# Patient Record
Sex: Male | Born: 1970 | Race: White | Hispanic: No | Smoking: Former smoker
Health system: Southern US, Community
[De-identification: ages and names within clinical notes are randomized; demographics above are authoritative.]

## PROBLEM LIST (undated history)

## (undated) DIAGNOSIS — M549 Dorsalgia, unspecified: Secondary | ICD-10-CM

## (undated) DIAGNOSIS — E785 Hyperlipidemia, unspecified: Secondary | ICD-10-CM

## (undated) DIAGNOSIS — J45909 Unspecified asthma, uncomplicated: Secondary | ICD-10-CM

## (undated) DIAGNOSIS — K7581 Nonalcoholic steatohepatitis (NASH): Secondary | ICD-10-CM

## (undated) DIAGNOSIS — T7840XA Allergy, unspecified, initial encounter: Secondary | ICD-10-CM

## (undated) HISTORY — DX: Hyperlipidemia, unspecified: E78.5

## (undated) HISTORY — DX: Unspecified asthma, uncomplicated: J45.909

## (undated) HISTORY — DX: Dorsalgia, unspecified: M54.9

## (undated) HISTORY — DX: Nonalcoholic steatohepatitis (NASH): K75.81

## (undated) HISTORY — DX: Allergy, unspecified, initial encounter: T78.40XA

## (undated) HISTORY — PX: BACK SURGERY: SHX140

## (undated) HISTORY — PX: APPENDECTOMY: SHX54

---

## 2008-08-06 HISTORY — PX: SPINE SURGERY: SHX786

## 2009-02-17 ENCOUNTER — Ambulatory Visit (HOSPITAL_COMMUNITY): Admission: RE | Admit: 2009-02-17 | Discharge: 2009-02-18 | Payer: Self-pay | Admitting: Neurological Surgery

## 2010-11-12 LAB — CBC
Platelets: 299 10*3/uL (ref 150–400)
RDW: 12.8 % (ref 11.5–15.5)

## 2010-12-19 NOTE — Op Note (Signed)
NAME:  Nathan Holder NO.:  [id number]   MEDICAL RECORD NO.:  [id number]          PATIENT TYPE:  OIB   LOCATION:  2330                         FACILITY:  [facility]   PHYSICIAN:  Earleen Newport, M.D.  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   PROCEDURE:  Repeat microdiskectomy at L4-L5 left with operating  microscope microdissection technique.   SURGEON:  Earleen Newport, MD   FIRST ASSISTANT:  Ashok Pall, MD   ANESTHESIA:  General endotracheal.   INDICATIONS:  Nathan Holder is a 40 year old male who in 2003 or 2004  underwent a microdiskectomy in [place], [place] at L4-L5 on the left  side.  He did well after his initial surgery; however, he has had the  sudden recurrence of pain since the beginning of June.  He has now been  able to get over this despite efforts at conservative management  including steroids, bedrest, exercise, physical therapy in the passage  of time.  Symptoms have only been getting worse and he notes that he had  some modest weakness in the tibialis anterior on the left side and he  has evidence of recurrent herniation of disk at L4-L5 on MRI recently  performed, having failed efforts at conservative management and he was  advised regarding surgery.   PROCEDURE:  The patient was brought to the operating room, supine on the  stretcher.  After smooth induction of general endotracheal anesthesia,  he was turned prone.  The back was prepped with alcohol and DuraPrep and  draped in a sterile fashion.  Previously made lumbar incision was opened  on the inferior portion of it and the dissection was carried down  through the  lumbodorsal fascia.  Fascia was opened on the left side of  the midline, then a singular localizing radiograph identified the L4-L5  interspace.  Self-retaining Caspar retractor was placed in the wound and  through this aperture, the laminotomy was created to removing the  inferior margin lamina of L4 out to the medial wall of the facet.  Partial facetectomy was performed at the L4-L5 level.  Yellow ligament  was taken up with the scar tissue that was adherent to the dura.  This  was carefully dissected out to the lateral aspect of the dural tube.  Underneath a side of the dura particularly superior to the area, the  disk space was identified a substantial fragment of disk.  This was  removed with some difficulty as it was adherent to underlying epidural  fibrotic scar tissue.  Careful dissection yielded good decompression on  this area.  Dissection was then taken across the disk space where there  were some other small fragments of discontiguous with the disk space  itself.  These were removed in a piecemeal fashion, again being very  adherent to the surrounding disk  and scar tissue and the L5 nerve root  was then dissected very carefully and was found to be tethered laterally  and inferiorly to some underlying disk material and scar tissue.  With  this being released, the L5 nerve root could easily being mobilized  medially.  Undersurface of the disk space was quite adherent to the dura  itself and this required substantial peripheral dissection.  Disk space  was evacuated of a substantial quantity of severely degenerated and  desiccated disk material.  Ultimately, the area was irrigated copiously  with antibiotic irrigating solution and after number of attempts of  dissection more medially and inferiorly, no further disk material could  be add.  With this the procedure was completed, hemostasis was achieved.  The lumbodorsal fascia was closed with #1 Vicryl in interrupted fashion,  2-0  Vicryl was used in the subcutaneous tissues, 3-0 Vicryl  subcuticularly and Dermabond was placed on the skin.  The patient  tolerated the procedure well.  Blood loss was estimated at less than 50  mL.      Earleen Newport, M.D.  Electronically Signed     HJE/MEDQ  D:  02/17/2009  T:  02/18/2009  Job:  [id number]

## 2012-05-12 ENCOUNTER — Ambulatory Visit: Payer: BC Managed Care – PPO

## 2012-05-12 ENCOUNTER — Ambulatory Visit (INDEPENDENT_AMBULATORY_CARE_PROVIDER_SITE_OTHER): Payer: BC Managed Care – PPO | Admitting: Family Medicine

## 2012-05-12 ENCOUNTER — Encounter: Payer: Self-pay | Admitting: Physician Assistant

## 2012-05-12 VITALS — BP 126/86 | HR 61 | Temp 98.8°F | Resp 16 | Ht 72.0 in | Wt 290.4 lb

## 2012-05-12 DIAGNOSIS — M25539 Pain in unspecified wrist: Secondary | ICD-10-CM

## 2012-05-12 DIAGNOSIS — M549 Dorsalgia, unspecified: Secondary | ICD-10-CM

## 2012-05-12 DIAGNOSIS — J45909 Unspecified asthma, uncomplicated: Secondary | ICD-10-CM

## 2012-05-12 DIAGNOSIS — M25569 Pain in unspecified knee: Secondary | ICD-10-CM

## 2012-05-12 DIAGNOSIS — Z0000 Encounter for general adult medical examination without abnormal findings: Secondary | ICD-10-CM

## 2012-05-12 LAB — POCT URINALYSIS DIPSTICK
Bilirubin, UA: NEGATIVE
Blood, UA: NEGATIVE
Glucose, UA: NEGATIVE
Leukocytes, UA: NEGATIVE
Nitrite, UA: NEGATIVE

## 2012-05-12 LAB — LIPID PANEL
LDL Cholesterol: 170 mg/dL — ABNORMAL HIGH (ref 0–99)
Triglycerides: 96 mg/dL (ref <150)

## 2012-05-12 LAB — COMPREHENSIVE METABOLIC PANEL
Albumin: 4.6 g/dL (ref 3.5–5.2)
Alkaline Phosphatase: 54 U/L (ref 39–117)
CO2: 26 mEq/L (ref 19–32)
Glucose, Bld: 90 mg/dL (ref 70–99)
Potassium: 4.7 mEq/L (ref 3.5–5.3)
Sodium: 143 mEq/L (ref 135–145)
Total Protein: 7.2 g/dL (ref 6.0–8.3)

## 2012-05-12 LAB — CBC
Hemoglobin: 15.9 g/dL (ref 13.0–17.0)
MCHC: 34.3 g/dL (ref 30.0–36.0)
RBC: 4.97 MIL/uL (ref 4.22–5.81)
WBC: 4.7 10*3/uL (ref 4.0–10.5)

## 2012-05-12 LAB — POCT UA - MICROSCOPIC ONLY
Mucus, UA: POSITIVE
Yeast, UA: NEGATIVE

## 2012-05-12 LAB — LIPASE: Lipase: 11 U/L (ref 0–75)

## 2012-05-12 MED ORDER — CYCLOBENZAPRINE HCL 5 MG PO TABS: 5.0000 mg | ORAL_TABLET | Freq: Three times a day (TID) | ORAL | Status: DC | PRN

## 2012-05-12 MED ORDER — NAPROXEN 500 MG PO TABS: 500.0000 mg | ORAL_TABLET | Freq: Two times a day (BID) | ORAL | Status: DC

## 2012-05-12 MED ORDER — ALBUTEROL SULFATE HFA 108 (90 BASE) MCG/ACT IN AERS: 2.0000 | INHALATION_SPRAY | RESPIRATORY_TRACT | Status: DC | PRN

## 2012-05-12 NOTE — Progress Notes (Signed)
  Subjective:    Patient ID: Nathan Holder, male    DOB: May 18, 1971, 41 y.o.   MRN: [id number]  HPI    Review of Systems  Constitutional: Negative.   HENT: Negative.   Eyes: Negative.   Respiratory: Positive for wheezing.   Cardiovascular: Negative.   Gastrointestinal: Negative.   Genitourinary: Negative.   Musculoskeletal: Positive for back pain.  Skin: Negative.   Neurological: Negative.   Hematological: Negative.   Psychiatric/Behavioral: Positive for agitation.       Objective:   Physical Exam        Assessment & Plan:

## 2012-05-12 NOTE — Progress Notes (Signed)
Patient ID: Nathan Holder MRN: [id number], DOB: 1971/01/24 41 y.o. Date of Encounter: 05/12/2012, 3:36 PM  Primary Physician: No primary provider on file.  Chief Complaint: Physical (CPE)  HPI: 41 y.o. y/o male with history noted below here for CPE. Doing well. He does have several issues that he would like to discuss at today's visit.  1) Low back pain: This is a long standing issue for him. He has undergone two micro diskectomies one in 2004 and one in 2010. Both involving L4-L5. Since his most recent operation in 2010 he has never fully been without discomfort. He states at best he was 85% better, and it has slowly gotten worse over time. He understands that he has not done everything that he could do to help matters also. He does not want to undergo another operation for his back. He is open to any conservative measure. Denies any loss of bowel or bladder function. No radiation into the legs.   2) Left knee pain: About 2-3 weeks ago he fell onto some concrete and hit his left knee. Since this time he has been complaining of some discomfort along the medial aspect of the knee. He states it feels llike there is "glass" in it. It never became swollen, bruised, or erythematous. Always had full range of motion and able to fully apply his weight to it. Pain does not radiate distally or proximally.  3) Right wrist pain: Same fall as above. Patient fell out onto out stretched hand. Complains of pain and a popping along the distal ulna. Never with any swelling, bruising, or erythema. He does have a small knot at the distal ulna now, and he believes that it was there prior to this fall. He does have full range of motion and normal sensation. Pain does not radiate distally or proximally.   4) Asthma: His asthma is well controlled at baseline. He usually only requires use of his rescue inhaler 2-3 times per month due to allergies. He does request a new inhaler today.  5) "Sleeping sensation" in his  LLQ: HE states that every so often he will have a sensation that something in his abdomen will "go to sleep, like when my leg goes to sleep." He states this began 2-3 years ago and comes and goes. It has not happened for several months now. Will only last for seconds to a couple minutes then self resolves. No known triggers. No pain associated. No GI or GU symptoms. Very regular BM's without BRBPR or melena. No constipation or diarrhea.   Works as a Freight forwarder. Originally from [place], [place]. Married with 2 children. Enjoys spending time with the family and playing with the kids. Wishes that he could do more, but he is limited due to his back.    Review of Systems: Consitutional: No fever, chills, fatigue, night sweats, lymphadenopathy, or weight changes. Eyes: No visual changes, eye redness, or discharge. ENT/Mouth: Ears: No otalgia, tinnitus, hearing loss, discharge. Nose: No congestion, rhinorrhea, sinus pain, or epistaxis. Throat: No sore throat, post nasal drip, or teeth pain. Cardiovascular: No CP, palpitations, diaphoresis, DOE, edema, orthopnea, PND. Respiratory: No cough, hemoptysis, SOB. Gastrointestinal: No anorexia, dysphagia, reflux, pain, nausea, vomiting, hematemesis, diarrhea, constipation, BRBPR, or melena. Genitourinary: No dysuria, frequency, urgency, hematuria, incontinence, nocturia, decreased urinary stream, discharge, impotence, or testicular pain/masses. Musculoskeletal: see above. Skin: No rash, erythema, lesion changes, pain, warmth, jaundice, or pruritis. Neurological: No headache, dizziness, syncope, seizures, tremors, memory loss, coordination problems, or paresthesias. Psychological: No  anxiety, depression, hallucinations, SI/HI. Endocrine: No fatigue, polydipsia, polyphagia, polyuria, or known diabetes. All other systems were reviewed and are otherwise negative.  Past Medical History  Diagnosis Date  . Allergy   . Asthma      Past Surgical History    Procedure Date  . Appendectomy   . Back surgery     Home Meds:  Prior to Admission medications   Medication Sig Start Date End Date Taking? Authorizing Provider  fish oil-omega-3 fatty acids 1000 MG capsule Take 2 g by mouth daily.   Yes Historical Provider, MD  Flaxseed, Linseed, (FLAX SEEDS PO) Take by mouth.   Yes Historical Provider, MD  Multiple Vitamin (MULTIVITAMIN) tablet Take 1 tablet by mouth daily.   Yes Historical Provider, MD  Naproxen Sodium (ALEVE PO) Take by mouth.   Yes Historical Provider, MD  OVER THE COUNTER MEDICATION Vitamin B6 taken twice a day   Yes Historical Provider, MD  [place] taking one tablet daily   Yes Historical Provider, MD  OVER THE COUNTER MEDICATION Capsium patches prn   Yes Historical Provider, MD    Allergies: No Known Allergies  History   Social History  . Marital Status: Married    Spouse Name: N/A    Number of Children: N/A  . Years of Education: N/A   Occupational History  . Not on file.   Social History Main Topics  . Smoking status: Former Research scientist (life sciences)  . Smokeless tobacco: Never Used  . Alcohol Use: 0.5 - 1.0 oz/week    1-2 drink(s) per week  . Drug Use: No  . Sexually Active: Not on file   Other Topics Concern  . Not on file   Social History Narrative  . No narrative on file    Family History  Problem Relation Age of Onset  . Adopted: Yes    Physical Exam: Blood pressure 126/86, pulse 61, temperature 98.8 F (37.1 C), temperature source Oral, resp. rate 16, height 6' (1.829 m), weight 290 lb 6.4 oz (131.725 kg), SpO2 97.00%.  General: Well developed, well nourished, in no acute distress. HEENT: Normocephalic, atraumatic. Conjunctiva pink, sclera non-icteric. Pupils 2 mm constricting to 1 mm, round, regular, and equally reactive to light and accomodation. EOMI. Internal auditory canal clear. TMs with good cone of light and without pathology. Nasal mucosa pink. Nares are without discharge.  No sinus tenderness. Oral mucosa pink. Dentition normal. Pharynx without exudate.   Neck: Supple. Trachea midline. No thyromegaly. Full ROM. No lymphadenopathy. Lungs: Clear to auscultation bilaterally without wheezes, rales, or rhonchi. Breathing is of normal effort and unlabored. Cardiovascular: RRR with S1 S2. No murmurs, rubs, or gallops appreciated. Distal pulses 2+ symmetrically. No carotid or abdominal bruits. Abdomen: Soft, non-tender, non-distended with normoactive bowel sounds. No hepatosplenomegaly or masses. No rebound/guarding. No CVA tenderness. Without hernias. Well healed vertical surgical scar right side of the umbilicus. No pulsations. Genitourinary: Circumcised male. No penile lesions. Testes descended bilaterally, and smooth without tenderness or masses.  Musculoskeletal: Right wrist: No STS, ecchymosis, or erythema. TTP distal ulna. FROM. Flexion/extension intact. No scafoid TTP. No TTP of the carpals, meta carpals, or phalanges. No TTP of the distal radius. No TTP with squeezing of the ulna and radius. Left knee: No effusion, ecchymosis, or erythema. TTP medial joint line. No patellar TTP, or lateral deviation of the patellar. No lateral joint line TTP. Negative Anterior drawer, Lachman's, Varus stress, Valgus stress, and McMurray's. Well healed prior surgical scaring of the lumbar  region of the back. No midline TTP or paraspinal muscle TTP. FROM. Negative SLR bilaterally. Full range of motion and 5/5 strength throughout. Without swelling, atrophy, tenderness, crepitus, or warmth. Extremities without clubbing, cyanosis, or edema. Calves supple. Skin: Warm and moist without erythema, ecchymosis, wounds, or rash. Neuro: A+Ox3. CN II-XII grossly intact. Moves all extremities spontaneously. Full sensation throughout. Normal gait. DTR 2+ throughout upper and lower extremities. Finger to nose intact. Psych:  Responds to questions appropriately with a normal affect.   Studies:  Results for  orders placed in visit on 05/12/12  POCT UA - MICROSCOPIC ONLY      Component Value Range   WBC, Ur, HPF, POC 2-3     RBC, urine, microscopic 1-2     Bacteria, U Microscopic 1+     Mucus, UA positive     Epithelial cells, urine per micros neg     Crystals, Ur, HPF, POC neg     Casts, Ur, LPF, POC neg     Yeast, UA neg    POCT URINALYSIS DIPSTICK      Component Value Range   Color, UA yellow     Clarity, UA clear     Glucose, UA neg     Bilirubin, UA neg     Ketones, UA neg     Spec Grav, UA >=1.030     Blood, UA neg     pH, UA 5.0     Protein, UA neg     Urobilinogen, UA 0.2     Nitrite, UA neg     Leukocytes, UA Negative      CBC, CMET, Lipid, PSA, TSH all pending. Patient is fasting.  [facility] reading (PRIMARY) by  Dr. Carlota Raspberry. Left knee: medial degenerative changes otherwise negative Right wrist: Negative  Assessment/Plan:  41 y.o. y/o very pleasant Caucasian male here for CPE with low back pain, left knee sprain, right wrist sprain, and asthma.  1. Low back pain -Status post microdiskectomy in 2004 and 2010  -Referral to PT -Naprosyn 500 mg 1 po bid prn with food #60 RF 6 -Flexeril 5 mg 1 po tid prn #30 RF 6 -Patient is vocal that he certainly does not wish to have a third operation on his back. He voices that he has not done what he should have to help curtail some of his back pain. Should PT not offer him good quality of life I recommend referral to orthopedics for repeat MRI.   2. Left knee sprain -Rest -Naprosyn 500 mg 1 po bid with food #60 RF 6  3. Right wrist sprain -Rest -Naprosyn 500 mg 1 po bid with food #60 RF 6  4. Asthma -Well controlled at baseline -Proventil 2 puffs inhaled q 4-6 hours prn #1 RF 2 -If he sees that he is needing to use this frequently he is to RTC for further evaluation  5. CPE -Healthy diet and exercise -Weight loss -Await labs, treatment and follow pending -Since he is adopted and does not know his family history I expressed  the importance of follow up visits to him today. We will certainly need to trend some of his analytes to better help him understand his health.  -Advised him to keep a food diary when he is symptomatic of his abdominal "sleeping" sensation. This has been unchanged for 2-3 years now and is currently not an active issue. This certainly could be an unsual presentation of a food allergy. Should this change or worsen in any way he  is to let me know. -Declines flu vaccine -Anticipatory guidance -Greater than 1 hour spent face to face with patient today   Signed, Christell Faith, PA-C 05/12/2012 3:36 PM

## 2012-05-13 ENCOUNTER — Encounter: Payer: Self-pay | Admitting: Radiology

## 2012-05-13 ENCOUNTER — Other Ambulatory Visit: Payer: Self-pay | Admitting: Physician Assistant

## 2012-05-13 MED ORDER — ATORVASTATIN CALCIUM 10 MG PO TABS: 10.0000 mg | ORAL_TABLET | Freq: Every day | ORAL | Status: DC

## 2012-05-13 NOTE — Progress Notes (Signed)
Please see detailed note under labs from CPE on 05/12/12.

## 2012-06-10 ENCOUNTER — Encounter: Payer: Self-pay | Admitting: Physician Assistant

## 2012-06-10 ENCOUNTER — Telehealth: Payer: Self-pay | Admitting: Physician Assistant

## 2012-06-10 NOTE — Telephone Encounter (Signed)
[place] [phone] stating that I have been granted access to the personal health record for this patient. Web address and credentials are:   [email or web address] [id number] [email or web address]

## 2012-06-25 ENCOUNTER — Telehealth: Payer: Self-pay

## 2012-06-25 DIAGNOSIS — M549 Dorsalgia, unspecified: Secondary | ICD-10-CM

## 2012-06-25 MED ORDER — TRAMADOL HCL 50 MG PO TABS: 50.0000 mg | ORAL_TABLET | Freq: Four times a day (QID) | ORAL | Status: DC | PRN

## 2012-06-25 NOTE — Telephone Encounter (Signed)
Please tell Nathan Holder that I have sent in some stronger pain medication and written a prescription for a tens unit for him. I hope he is doing well.

## 2012-06-25 NOTE — Telephone Encounter (Signed)
Noted  

## 2012-06-25 NOTE — Telephone Encounter (Signed)
Ryan, do you want to Rx a TENS unit and/or a stronger pain medication?

## 2012-06-25 NOTE — Telephone Encounter (Signed)
PT SAW RYAN FOR BACK PAIN.  HE HAS BEEN GOING TO PHYSICAL THERAPY AND THE THERAPIST WOULD LIKE FOR RYAN TO WRITE A PRESCRIPTION FOR A TENS UNIT.  HE SAID THE EXERCISE AND THERAPY IS HELPING AND HAS MADE HIM MORE FLEXIBLE, BUT ALONG WITH THAT IS HAVING MORE PAIN.  WANTS TO KNOW IF WE CAN CALL HIM IN A STRONGER PAIN MEDICATION.  [phone]

## 2012-06-25 NOTE — Telephone Encounter (Signed)
Notified pt of Rx being sent to pharmacy and Rx for TENS unit is ready. Mailed TENS Rx to pt per his request. Pt wanted Nathan Holder to know that the naproxen and muscle relaxer absolutely helped his wrist, knee and back. It was only after starting the PT and exercising that his back pain returned/worsened. Pt will f/up as instr'd or sooner if needed.

## 2012-08-20 ENCOUNTER — Other Ambulatory Visit: Payer: Self-pay | Admitting: Physician Assistant

## 2012-09-29 ENCOUNTER — Encounter: Payer: Self-pay | Admitting: Physician Assistant

## 2012-09-29 ENCOUNTER — Ambulatory Visit (INDEPENDENT_AMBULATORY_CARE_PROVIDER_SITE_OTHER): Payer: BC Managed Care – PPO | Admitting: Physician Assistant

## 2012-09-29 VITALS — BP 122/70 | HR 78 | Temp 98.0°F | Resp 16 | Ht 71.75 in | Wt 283.2 lb

## 2012-09-29 DIAGNOSIS — M549 Dorsalgia, unspecified: Secondary | ICD-10-CM

## 2012-09-29 DIAGNOSIS — M25531 Pain in right wrist: Secondary | ICD-10-CM

## 2012-09-29 DIAGNOSIS — M25569 Pain in unspecified knee: Secondary | ICD-10-CM

## 2012-09-29 DIAGNOSIS — N478 Other disorders of prepuce: Secondary | ICD-10-CM

## 2012-09-29 DIAGNOSIS — J45909 Unspecified asthma, uncomplicated: Secondary | ICD-10-CM

## 2012-09-29 DIAGNOSIS — M25539 Pain in unspecified wrist: Secondary | ICD-10-CM

## 2012-09-29 DIAGNOSIS — M25562 Pain in left knee: Secondary | ICD-10-CM

## 2012-09-29 DIAGNOSIS — N475 Adhesions of prepuce and glans penis: Secondary | ICD-10-CM

## 2012-09-29 MED ORDER — ALBUTEROL SULFATE HFA 108 (90 BASE) MCG/ACT IN AERS: 2.0000 | INHALATION_SPRAY | RESPIRATORY_TRACT | Status: DC | PRN

## 2012-09-29 MED ORDER — NAPROXEN 500 MG PO TABS: 500.0000 mg | ORAL_TABLET | Freq: Two times a day (BID) | ORAL | Status: DC

## 2012-09-29 MED ORDER — TRAMADOL HCL 50 MG PO TABS: 50.0000 mg | ORAL_TABLET | Freq: Four times a day (QID) | ORAL | Status: DC | PRN

## 2012-09-29 MED ORDER — CYCLOBENZAPRINE HCL 5 MG PO TABS: 5.0000 mg | ORAL_TABLET | Freq: Three times a day (TID) | ORAL | Status: DC | PRN

## 2012-09-29 NOTE — Progress Notes (Signed)
Patient ID: Nathan Holder MRN: [id number], DOB: [birth year], 42 y.o. Date of Encounter: 09/29/2012, 6:00 PM  Primary Physician: No primary provider on file.  Chief Complaint: Medication refill  HPI: 42 y.o. year old male with history below presents for medication refill. Doing well. No issues or complaints. He has stopped going to see the physical therapist secondary to his improvement. He will work on the exercises they showed him 3-4 times per week. He no longer has any left knee pain or right wrist pain. He still does have some back pain that is well controlled with current medication regimen. He is currently only taking one Naprosyn 500 mg in the morning, one Ultram 50 mg later in the day, and one Flexeril 5 mg qhs prn. He states this is working well for him.   His asthma is well controlled. He states that he uses his albuterol inhaler maybe 3 times every 2 weeks. He does request a generic refill because the price of Proventil went up to $67.00.   Lastly he states that his wife frequently gets a vaginal yeast infection after intercourse. 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 He is open to having this surgically corrected. They do not want to go back to using condoms at this time and he is looking to help her quality of life.   Eating healthy. Trying to lose weight.     Past Medical History  Diagnosis Date  . Allergy   . Asthma   . Back pain      Home Meds: Prior to Admission medications   Medication Sig Start Date End Date Taking? Authorizing Provider  albuterol (PROVENTIL HFA;VENTOLIN HFA) 108 (90 BASE) MCG/ACT inhaler Inhale 2 puffs into the lungs every 4 (four) hours as needed for wheezing. 05/12/12  Yes Kyliegh Jester M Emileo Semel, PA-C  atorvastatin (LIPITOR) 10 MG tablet Take 1 tablet (10 mg total) by  mouth daily. 05/13/12  Yes Azzie Thiem M Miriam Liles, PA-C  cyclobenzaprine (FLEXERIL) 5 MG tablet Take 1 tablet (5 mg total) by mouth 3 (three) times daily as needed for muscle spasms. 05/12/12  Yes Taylour Lietzke M Kimmora Risenhoover, PA-C  fish oil-omega-3 fatty acids 1000 MG capsule Take 2 g by mouth daily.   Yes Historical Provider, MD  Flaxseed, Linseed, (FLAX SEEDS PO) Take by mouth.   Yes Historical Provider, MD  Multiple Vitamin (MULTIVITAMIN) tablet Take 1 tablet by mouth daily.   Yes Historical Provider, MD  naproxen (NAPROSYN) 500 MG tablet Take 1 tablet (500 mg total) by mouth 2 (two) times daily with a meal. 05/12/12  Yes Rashi Granier M Rommie Theophilus Walz, PA-C  OVER THE COUNTER MEDICATION Vitamin B6 taken twice a day   Yes Historical Provider, MD  traMADol (ULTRAM) 50 MG tablet TAKE 1 TABLET (50 MG TOTAL) BY MOUTH EVERY 6 (SIX) HOURS AS NEEDED FOR PAIN. 08/20/12  Yes Evi Mccomb M Allie Gerhold, PA-C  Naproxen Sodium (ALEVE PO) Take by mouth.    Historical Provider, MD  [place] taking one tablet daily    Historical Provider, MD  OVER THE COUNTER MEDICATION Capsium patches prn    Historical Provider, MD    Allergies: No Known Allergies  History   Social History  . Marital Status: Married    Spouse Name: N/A    Number of Children: N/A  .  Years of Education: N/A   Occupational History  . teacher    Social History Main Topics  . Smoking status: Former Research scientist (life sciences)  . Smokeless tobacco: Never Used  . Alcohol Use: .5 - 1 oz/week    1-2 drink(s) per week     Comment: drink daily-2 drinks  . Drug Use: No  . Sexually Active: Yes    Birth Control/ Protection: None     Comment: number of sex partners in the last 46 months 1   Other Topics Concern  . Not on file   Social History Narrative   Exercise stretches 3 x per week for 30-45 minutes     Review of Systems: Constitutional: negative for chills, fever, night sweats, weight changes, or fatigue  Cardiovascular: negative for chest pain or palpitations Respiratory:  negative for hemoptysis, wheezing, shortness of breath, or cough Musculoskeletal: see above Dermatological: negative for rash Neurologic: negative for headache, dizziness, or syncope   Physical Exam: Blood pressure 122/70, pulse 78, temperature 98 F (36.7 C), temperature source Oral, resp. rate 16, height 5' 11.75" (1.822 m), weight 283 lb 3.2 oz (128.459 kg), SpO2 97.00%., Body mass index is 38.7 kg/(m^2). General: Well developed, well nourished, in no acute distress. Head: Normocephalic, atraumatic, eyes without discharge, sclera non-icteric, nares are without discharge.   Neck: Supple. Full ROM. Lungs: Clear bilaterally to auscultation without wheezes, rales, or rhonchi. Breathing is unlabored. Heart: RRR with S1 S2. No murmurs, rubs, or gallops appreciated. Msk:  Strength and tone normal for age. Extremities/Skin: Warm and dry. No clubbing or cyanosis. No edema. No rashes or suspicious lesions. Back: Full range of motion and strength.   Neuro: Alert and oriented X 3. Moves all extremities spontaneously. Gait is normal. CNII-XII grossly in tact. Psych:  Responds to questions appropriately with a normal affect.     ASSESSMENT AND PLAN:  42 y.o. male with resolved knee pain, wrist pain, much improved back pain, asthma, and foreskin adhesion.  1. Knee pain -Resolved -Continue exercises  2. Wrist pain -Resolved -Continue exercises  3. Back pain -Much improved -Continue current treatment -Continue exercises -Naprosyn 500 mg 1 po bid prn #60 RF 6 -Ultram 50 mg 1 po q 6 hours prn #120 no RF, may call through August -Flexeril 5 mg 1 po tid prn #90 RF 6  4. Asthma -Well controlled -Continue current treatment -Ventolin 2 puffs inhaled q 4-6 hours prn #1 RF 2  5. Foreskin adhesion -Referral to Urology  6. Follow up 6 months    Signed, Christell Faith, PA-C 09/29/2012 6:00 PM

## 2012-12-03 ENCOUNTER — Other Ambulatory Visit: Payer: Self-pay | Admitting: Physician Assistant

## 2013-02-18 ENCOUNTER — Telehealth: Payer: Self-pay

## 2013-02-18 DIAGNOSIS — M549 Dorsalgia, unspecified: Secondary | ICD-10-CM

## 2013-02-18 MED ORDER — TRAMADOL HCL 50 MG PO TABS: 100.0000 mg | ORAL_TABLET | Freq: Four times a day (QID) | ORAL | Status: DC | PRN

## 2013-02-18 NOTE — Telephone Encounter (Signed)
Follow up on a prescription for tramadol. Please call pharmacy at earliest convenience.

## 2013-02-18 NOTE — Telephone Encounter (Signed)
?   Return office visit.

## 2013-02-18 NOTE — Telephone Encounter (Signed)
Sent in. May call for refill through August.

## 2013-04-01 ENCOUNTER — Other Ambulatory Visit: Payer: Self-pay | Admitting: Physician Assistant

## 2013-04-01 NOTE — Telephone Encounter (Signed)
Needs OV.  

## 2013-04-29 ENCOUNTER — Other Ambulatory Visit: Payer: Self-pay | Admitting: Physician Assistant

## 2013-04-29 NOTE — Telephone Encounter (Signed)
Ryan

## 2013-04-30 ENCOUNTER — Other Ambulatory Visit: Payer: Self-pay | Admitting: Physician Assistant

## 2013-06-01 ENCOUNTER — Ambulatory Visit (INDEPENDENT_AMBULATORY_CARE_PROVIDER_SITE_OTHER): Payer: BC Managed Care – PPO | Admitting: Physician Assistant

## 2013-06-01 ENCOUNTER — Encounter: Payer: Self-pay | Admitting: Physician Assistant

## 2013-06-01 VITALS — BP 138/100 | HR 73 | Temp 98.0°F | Resp 16 | Ht 71.5 in | Wt 295.3 lb

## 2013-06-01 DIAGNOSIS — M62838 Other muscle spasm: Secondary | ICD-10-CM

## 2013-06-01 DIAGNOSIS — E785 Hyperlipidemia, unspecified: Secondary | ICD-10-CM

## 2013-06-01 DIAGNOSIS — Z23 Encounter for immunization: Secondary | ICD-10-CM

## 2013-06-01 DIAGNOSIS — J45909 Unspecified asthma, uncomplicated: Secondary | ICD-10-CM

## 2013-06-01 DIAGNOSIS — M549 Dorsalgia, unspecified: Secondary | ICD-10-CM

## 2013-06-01 LAB — LIPID PANEL
Cholesterol: 175 mg/dL (ref 0–200)
HDL: 40 mg/dL (ref >39)
Triglycerides: 146 mg/dL (ref <150)

## 2013-06-01 LAB — COMPREHENSIVE METABOLIC PANEL
BUN: 12 mg/dL (ref 6–23)
CO2: 30 mEq/L (ref 19–32)
Creat: 0.94 mg/dL (ref 0.50–1.35)
Glucose, Bld: 95 mg/dL (ref 70–99)
Total Bilirubin: 0.5 mg/dL (ref 0.3–1.2)

## 2013-06-01 LAB — CBC
HCT: 47 % (ref 39.0–52.0)
MCV: 91.4 fL (ref 78.0–100.0)
Platelets: 257 10*3/uL (ref 150–400)
RBC: 5.14 MIL/uL (ref 4.22–5.81)
WBC: 7.3 10*3/uL (ref 4.0–10.5)

## 2013-06-01 MED ORDER — TIZANIDINE HCL 4 MG PO CAPS: 4.0000 mg | ORAL_CAPSULE | Freq: Every evening | ORAL | Status: DC | PRN

## 2013-06-01 NOTE — Progress Notes (Signed)
Patient ID: Nathan Holder MRN: [id number], DOB: February 04, 1971, 42 y.o. Date of Encounter: 06/02/2013, 8:30 AM  Primary Physician: No primary provider on file.  Chief Complaint: Medication refill  HPI: 42 y.o. male with history below presents for medication refill. Doing well. Low back pain is stable. Ultram 50 mg 2 po bid to qid usually resolves the pain. He does take a flexeril 5 mg tab around 11 AM daily as well. He is pleased with this progress.  Asthma is well controlled. Sometimes has a little wheezing at the change of the seasons. Surprisingly he has not been wheezing in dusty environments lately. He does not need a refill of his albuterol inhaler at this time.   He also mentions that he feels like he wasted his summer. 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 He does not know if he is becoming depressed or if this is just a rough patch. No SI or HI. He does just fine if he stays busy throughout the day. He does notice a change though if he does not have a lot planned for that day.   Lastly, he does mention having some mid back pain after stretching his bilateral arms forward on 05/28/13 at his daughter's volleyball game. Upon doing this he felt a pop. Since this episode the pain has progressively been getting better but he would like to get this checked out while he is here. No weakness. No numbness or tingling.    Past Medical History  Diagnosis Date  . Allergy   . Asthma   . Back pain      Home Meds: Prior to Admission medications   Medication Sig Start Date End Date Taking? Authorizing Provider  albuterol (VENTOLIN HFA) 108 (90 BASE) MCG/ACT inhaler Inhale 2 puffs into the lungs every 4 (four) hours as needed for wheezing. 09/29/12   Rise Mu, PA-C  atorvastatin (LIPITOR) 10 MG tablet TAKE 1 TABLET EVERY DAY (NEED  APPT, LABS) 04/30/13   Chelle S Jeffery, PA-C  cyclobenzaprine (FLEXERIL) 5 MG tablet Take 1 tablet (5 mg total) by mouth 3 (three) times daily as needed for muscle spasms. 09/29/12   Rise Mu, PA-C  fish oil-omega-3 fatty acids 1000 MG capsule Take 2 g by mouth daily.    Historical Provider, MD  Flaxseed, Linseed, (FLAX SEEDS PO) Take by mouth.    Historical Provider, MD  Multiple Vitamin (MULTIVITAMIN) tablet Take 1 tablet by mouth daily.    Historical Provider, MD  naproxen (NAPROSYN) 500 MG tablet Take 1 tablet (500 mg total) by mouth 2 (two) times daily with a meal. 09/29/12   Areta Haber Markitta Ausburn, PA-C  Naproxen Sodium (ALEVE PO) Take by mouth.    Historical Provider, MD  OVER THE COUNTER MEDICATION Vitamin B6 taken twice a day    Historical Provider, MD  [place] taking one tablet daily    Historical Provider, MD  OVER THE COUNTER MEDICATION Capsium patches prn    Historical Provider, MD  traMADol (ULTRAM) 50 MG tablet TAKE 2 TABLETS BY MOUTH EVERY 6 HOURS AS NEEDED FOR PAIN 04/29/13   Rise Mu, PA-C    Allergies: No Known Allergies  History   Social History  . Marital Status: Married  Spouse Name: N/A    Number of Children: N/A  . Years of Education: N/A   Occupational History  . teacher    Social History Main Topics  . Smoking status: Former Research scientist (life sciences)  . Smokeless tobacco: Never Used  . Alcohol Use: .5 - 1 oz/week    1-2 drink(s) per week     Comment: drink daily-2 drinks  . Drug Use: No  . Sexual Activity: Yes    Birth Control/ Protection: None     Comment: number of sex partners in the last 81 months 1   Other Topics Concern  . Not on file   Social History Narrative   Exercise stretches 3 x per week for 30-45 minutes     Review of Systems: Constitutional: negative for chills, fever, or fatigue  HEENT: negative for vision changes or hearing loss Musculoskeletal: positive for back pain Dermatological: negative for rash Psychological:  positive for depression. Negative for anxiety, anhedonia, SI, or HI Neurologic: negative for headache, dizziness, or syncope   Physical Exam: Blood pressure 138/100, pulse 73, temperature 98 F (36.7 C), temperature source Oral, resp. rate 16, height 5' 11.5" (1.816 m), weight 295 lb 4.8 oz (133.947 kg), SpO2 97.00%., Body mass index is 40.62 kg/(m^2). General: Well developed, well nourished, in no acute distress. Head: Normocephalic, atraumatic, eyes without discharge, sclera non-icteric, nares are without discharge. Bilateral auditory canals clear, TM's are without perforation, pearly grey and translucent with reflective cone of light bilaterally. Oral cavity moist, posterior pharynx without exudate, erythema, peritonsillar abscess, or post nasal drip.  Neck: Supple. No thyromegaly. Full ROM. No lymphadenopathy. Lungs: Clear bilaterally to auscultation without wheezes, rales, or rhonchi. Breathing is unlabored. Heart: RRR with S1 S2. No murmurs, rubs, or gallops appreciated. Back: No midline TTP throughout. Mild TTP left paraspinal muscles along T 11-T12. No radiation of TTP laterally. FROM. 5/5 strength along back and upper extremities.  Msk:  Strength and tone normal for age. Extremities/Skin: Warm and dry. No clubbing or cyanosis. No edema. No rashes or suspicious lesions. Neuro: Alert and oriented X 3. Moves all extremities spontaneously. Gait is normal. CNII-XII grossly in tact. Psych:  Responds to questions appropriately with a normal affect.   Labs: CMP, lipid, CBC all pending. Patient is fasting.   ASSESSMENT AND PLAN:  42 y.o. male with mid back strain/spasm, well controlled asthma, chronic low back pain, and questionable mild depressive disorder   1) Mid back strain/spasm -Muscle strain/spasm in etiology likely -Zanaflex 4 mg 1 po qhs #30 no RF   2) Asthma -Well controlled -Continue current treatment -Ventolin 2 puffs inhaled q 4-6 hours  3) Chronic low back pain -Doing  well -Continue current treatment -May call for refills as needed  4) Questionable mild depressive disorder -I do not feel this needs medication at this time, however I do feel he could benefit from speaking with a psychologist further -Follow up 1 month -No SI/HI     Signed, Christell Faith, PA-C [facility] [place], [place] [id number] [phone] 06/02/2013 8:30 AM

## 2013-06-02 ENCOUNTER — Other Ambulatory Visit: Payer: Self-pay | Admitting: Physician Assistant

## 2013-06-02 DIAGNOSIS — E785 Hyperlipidemia, unspecified: Secondary | ICD-10-CM

## 2013-06-02 MED ORDER — ATORVASTATIN CALCIUM 10 MG PO TABS: 10.0000 mg | ORAL_TABLET | Freq: Every day | ORAL | Status: DC

## 2013-06-27 ENCOUNTER — Other Ambulatory Visit: Payer: Self-pay | Admitting: Physician Assistant

## 2013-06-27 DIAGNOSIS — M549 Dorsalgia, unspecified: Secondary | ICD-10-CM

## 2013-07-06 ENCOUNTER — Ambulatory Visit (INDEPENDENT_AMBULATORY_CARE_PROVIDER_SITE_OTHER): Payer: BC Managed Care – PPO | Admitting: Physician Assistant

## 2013-07-06 ENCOUNTER — Encounter: Payer: Self-pay | Admitting: Physician Assistant

## 2013-07-06 VITALS — BP 129/84 | HR 77 | Temp 98.6°F | Resp 16 | Ht 71.5 in | Wt 297.0 lb

## 2013-07-06 DIAGNOSIS — M25531 Pain in right wrist: Secondary | ICD-10-CM

## 2013-07-06 DIAGNOSIS — M549 Dorsalgia, unspecified: Secondary | ICD-10-CM

## 2013-07-06 DIAGNOSIS — F329 Major depressive disorder, single episode, unspecified: Secondary | ICD-10-CM

## 2013-07-06 DIAGNOSIS — F3289 Other specified depressive episodes: Secondary | ICD-10-CM

## 2013-07-06 DIAGNOSIS — M25562 Pain in left knee: Secondary | ICD-10-CM

## 2013-07-06 MED ORDER — NAPROXEN 500 MG PO TABS: 500.0000 mg | ORAL_TABLET | Freq: Two times a day (BID) | ORAL | Status: DC

## 2013-07-06 MED ORDER — CYCLOBENZAPRINE HCL 5 MG PO TABS: 5.0000 mg | ORAL_TABLET | Freq: Three times a day (TID) | ORAL | Status: DC | PRN

## 2013-07-06 NOTE — Progress Notes (Signed)
Patient ID: ZACHARIAH PAVEK MRN: [id number], DOB: [birth year], 42 y.o. Date of Encounter: 07/06/2013, 4:24 PM  Primary Physician: No primary provider on file.  Chief Complaint: Follow up   HPI: 42 y.o. male with history below presents for follow up of depression. At our last visit on 06/01/13 he mentioned that he felt like he wasted his summer. He would sit around all day, watch TV or read. He did not spend much time outside or being active. States this is not like him. He notes that when he does not have much on his schedule he feels like he is some what down or depressed. If he has a fair amount on his schedule to get done for the day he does ok and does not notice any issues. At that time he was not sure if he was depressed or if he just hit a rough patch.   Today he states that he is in a much better place. He recently went to the beach with his parents for Thanksgiving and really enjoyed this. He has stayed quite busy with work and his family and has not noticed any further issues similar to the above. He did discuss this with his wife and she did not feel like he was depressed. He does not feel like he has a "chemicial imbalance." He feels like this is just life. He states that he may have just hit a rough patch. He has many hobbies outside of work and enjoys spending time with his children. He has no problem getting up and out of bed each and everyday. He denies any thoughts of harming himself or others. No SI or HI. He denies any anxiety.   His mid back pain from our last office visit also continues to improve quite well. He continues to use his TENs unit and states this helps him a lot. He requests to have refills of his flexeril and naproxen on file at the pharmacy.    Past Medical History  Diagnosis Date  . Allergy   . Asthma   . Back pain      Home Meds: Prior to Admission medications   Medication Sig Start Date End Date Taking? Authorizing Provider  albuterol (VENTOLIN HFA)  108 (90 BASE) MCG/ACT inhaler Inhale 2 puffs into the lungs every 4 (four) hours as needed for wheezing. 09/29/12   Rise Mu, PA-C  atorvastatin (LIPITOR) 10 MG tablet Take 1 tablet (10 mg total) by mouth daily. 06/02/13   Areta Haber Aviannah Castoro, PA-C  cyclobenzaprine (FLEXERIL) 5 MG tablet Take 1 tablet (5 mg total) by mouth 3 (three) times daily as needed for muscle spasms. 09/29/12   Rise Mu, PA-C  fish oil-omega-3 fatty acids 1000 MG capsule Take 2 g by mouth daily.    Historical Provider, MD  Flaxseed, Linseed, (FLAX SEEDS PO) Take by mouth.    Historical Provider, MD  Multiple Vitamin (MULTIVITAMIN) tablet Take 1 tablet by mouth daily.    Historical Provider, MD  naproxen (NAPROSYN) 500 MG tablet Take 1 tablet (500 mg total) by mouth 2 (two) times daily with a meal. 09/29/12   Areta Haber Seher Schlagel, PA-C  Naproxen Sodium (ALEVE PO) Take by mouth.    Historical Provider, MD  OVER THE COUNTER MEDICATION Vitamin B6 taken twice a day    Historical Provider, MD  [place] taking one tablet daily    Historical Provider, MD  OVER THE COUNTER MEDICATION Capsium patches prn  Historical Provider, MD  tiZANidine (ZANAFLEX) 4 MG capsule Take 1 capsule (4 mg total) by mouth at bedtime as needed for muscle spasms. 06/01/13   Rise Mu, PA-C  traMADol (ULTRAM) 50 MG tablet TAKE 2 TABLETS BY MOUTH EVERY 6 HOURS AS NEEDED FOR PAIN 06/27/13   Rise Mu, PA-C    Allergies: No Known Allergies  History   Social History  . Marital Status: Married    Spouse Name: N/A    Number of Children: N/A  . Years of Education: N/A   Occupational History  . teacher    Social History Main Topics  . Smoking status: Former Research scientist (life sciences)  . Smokeless tobacco: Never Used  . Alcohol Use: .5 - 1 oz/week    1-2 drink(s) per week     Comment: drink daily-2 drinks  . Drug Use: No  . Sexual Activity: Yes    Birth Control/ Protection: None     Comment: number of sex partners in the last 2 months 1    Other Topics Concern  . Not on file   Social History Narrative   Exercise stretches 3 x per week for 30-45 minutes     Review of Systems: Constitutional: negative for chills, fever, or fatigue  HEENT: negative for vision changes or hearing loss Dermatological: negative for rash Psychological: negative for depression, anxiety, anhedonia, SI, or HI Neurologic: negative for headache   Physical Exam: Blood pressure 129/84, pulse 77, temperature 98.6 F (37 C), resp. rate 16, height 5' 11.5" (1.816 m), weight 297 lb (134.718 kg)., Body mass index is 40.85 kg/(m^2). General: Well developed, well nourished, in no acute distress. Head: Normocephalic, atraumatic, eyes without discharge, sclera non-icteric, nares are without discharge.   Neck: Supple. Full ROM.  Lungs: Breathing is unlabored. Heart: Regular rate. Msk:  Strength and tone normal for age. Extremities/Skin: Warm and dry. No clubbing or cyanosis. No edema. No rashes or suspicious lesions. Neuro: Alert and oriented X 3. Moves all extremities spontaneously. Gait is normal. CNII-XII grossly in tact. Psych:  Responds to questions appropriately with a normal affect.     ASSESSMENT AND PLAN:  42 y.o. male with depressive episode, mid back strain, and chronic low back pain  1) Depressive episode -I do not feel that he would benefit from an antidepressant at this time -He has worked his way through his difficulties with lifestyle management -It was discussed in detail that he may benefit from counseling, however he is doing so well right now I am not sure what benefit that would offer him. So we decided together that should he develop any further depressive symptoms he is to let me know and I will set him up with counseling at that time. He agrees with this plan.  2) Mid back strain -Improving -Continue current treatment  3) Chronic low back pain -Stable -Refilled Flexeril 5 mg 1 po tid #90 RF 6 -Refilled Naprosyn 500 mg 1  po bid #60 RF 6   Signed, Christell Faith, PA-C [facility], [place] [phone] 07/06/2013 4:24 PM

## 2013-08-18 ENCOUNTER — Other Ambulatory Visit: Payer: Self-pay | Admitting: Physician Assistant

## 2013-08-18 DIAGNOSIS — M549 Dorsalgia, unspecified: Secondary | ICD-10-CM

## 2013-08-18 NOTE — Telephone Encounter (Signed)
Faxed,

## 2013-10-14 ENCOUNTER — Other Ambulatory Visit: Payer: Self-pay | Admitting: Physician Assistant

## 2013-10-16 NOTE — Telephone Encounter (Signed)
faxed

## 2013-10-17 ENCOUNTER — Other Ambulatory Visit: Payer: Self-pay | Admitting: Physician Assistant

## 2013-10-20 NOTE — Telephone Encounter (Signed)
Faxed

## 2014-02-08 ENCOUNTER — Other Ambulatory Visit: Payer: Self-pay | Admitting: Physician Assistant

## 2014-02-10 ENCOUNTER — Telehealth: Payer: Self-pay

## 2014-02-10 MED ORDER — TRAMADOL HCL 50 MG PO TABS: 100.0000 mg | ORAL_TABLET | Freq: Four times a day (QID) | ORAL | Status: DC | PRN

## 2014-02-10 NOTE — Telephone Encounter (Signed)
#  60 tablets printed - needs OV for additional refills

## 2014-02-10 NOTE — Telephone Encounter (Signed)
Patient states pharmacy denied his refill on his Tramadol-no indication on why denied. Patient requesting refill to be sent to [facility] [phone]. Patients call back number is [phone]

## 2014-02-11 NOTE — Telephone Encounter (Signed)
Spoke to pt, he is aware his prescription is waiting at the front desk for his p/u.

## 2014-04-05 ENCOUNTER — Ambulatory Visit (INDEPENDENT_AMBULATORY_CARE_PROVIDER_SITE_OTHER): Payer: BC Managed Care – PPO | Admitting: Family Medicine

## 2014-04-05 ENCOUNTER — Encounter: Payer: Self-pay | Admitting: Family Medicine

## 2014-04-05 VITALS — BP 130/80 | HR 69 | Temp 98.8°F | Resp 16 | Ht 72.0 in | Wt 289.2 lb

## 2014-04-05 DIAGNOSIS — Z7689 Persons encountering health services in other specified circumstances: Secondary | ICD-10-CM

## 2014-04-05 DIAGNOSIS — M545 Low back pain, unspecified: Secondary | ICD-10-CM

## 2014-04-05 DIAGNOSIS — Z7189 Other specified counseling: Secondary | ICD-10-CM

## 2014-04-05 DIAGNOSIS — R296 Repeated falls: Secondary | ICD-10-CM

## 2014-04-05 DIAGNOSIS — E7800 Pure hypercholesterolemia, unspecified: Secondary | ICD-10-CM | POA: Insufficient documentation

## 2014-04-05 DIAGNOSIS — E669 Obesity, unspecified: Secondary | ICD-10-CM

## 2014-04-05 DIAGNOSIS — G8929 Other chronic pain: Secondary | ICD-10-CM

## 2014-04-05 DIAGNOSIS — Z9181 History of falling: Secondary | ICD-10-CM

## 2014-04-05 MED ORDER — TRAMADOL HCL 50 MG PO TABS: 50.0000 mg | ORAL_TABLET | Freq: Two times a day (BID) | ORAL | Status: DC | PRN

## 2014-04-05 NOTE — Progress Notes (Addendum)
Subjective:  This chart was scribed for [name], MD by Ladene Artist, ED Scribe. The patient was seen in room 21. Patient's care was started at 3:42 PM.   Patient ID: Nathan Holder, male    DOB: 1970/10/27, 43 y.o.   MRN: [id number]  04/05/2014  Medication Refill and Back Pain  HPI HPI Comments: Nathan Holder is a 43 y.o. male who presents to the [facility] for a medication refill of Tramadol for chronic lower back pain. Pt last saw Christell Faith, PA-C on 07/06/13. Pt has a TENS unit at home for chronic back pain. Pt has has had physical therapy for chronic lower back pain. Status post 2 microdiskectomies in 2004 and 2010 involving L4 and L5.   Pt states that he is able to tolerate his back pain. He reports good and bad days regarding back pain. He has been making a list of activities that exacerbate his pain. Pt denies pain that radiates to his legs, numbness/tingling, difficulty urinating. Pt denies worsened back pain since last seen.   Pt reports more frequent falls including a slip and fall on a puddle of water yesterday while at [facility]. He also reports occasionally missing steps  x1-2 per year. He reports a total of 6 slips in the past year. He denies stumbling, dizziness, vertigo, numbness/tingling in extremities. Pt states that he is able to get back up following falls. Pt states that he does not exercise but would like to discuss weight loss in the future.   Pt takes Fish Oil, Vitamin B6, 1 Naproxen tablet and 1 Lipitor tablet in the morning. Pt takes 1 Tramadol tablet and 1 Flexeril tablet at 10 AM. If pt has a rough day, he takes another Tramadol tablet and another Flexeril tablet at 8 PM.   Both of his parent's are still living however pt is adopted. He is not knowledgeable of any blood siblings.   Pt has been married for 17 years with 2 children; 15 y.o daughter and 38 y.o son. Pt's wife works at [employer]. He denies tobacco use. Pt  consumes a maximum of 2 alcoholic beverages daily. 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 Pt traveled to [place] for 2 weeks and [place] for 2 weeks over the summer.   Review of Systems  Constitutional: Negative for fever, chills, activity change and fatigue.  Musculoskeletal: Positive for back pain (chronic; unchanged) and myalgias. Negative for neck pain and neck stiffness.  Neurological: Negative for dizziness, tremors, seizures, syncope, facial asymmetry, speech difficulty, weakness, light-headedness, numbness and headaches.   Past Medical History  Diagnosis Date  . Allergy   . Asthma   . Back pain    Past Surgical History  Procedure Laterality Date  . Appendectomy    . Back surgery    . Spine surgery  08/06/2008    diskectomy L4-5 2010, 2004.   No Known Allergies Current Outpatient Prescriptions  Medication Sig Dispense Refill  . albuterol (VENTOLIN HFA) 108 (90 BASE) MCG/ACT inhaler Inhale 2 puffs into the lungs every 4 (four) hours as needed for wheezing.  1 Inhaler  2  . atorvastatin (LIPITOR) 10 MG tablet Take 1 tablet (10 mg total) by mouth daily.  90 tablet  4  . cyclobenzaprine (FLEXERIL) 5 MG tablet Take 1 tablet (5 mg total) by mouth 3 (three) times daily as needed for muscle spasms.  90 tablet  6  .  fish oil-omega-3 fatty acids 1000 MG capsule Take 2 g by mouth daily.      . Flaxseed, Linseed, (FLAX SEEDS PO) Take by mouth.      . naproxen (NAPROSYN) 500 MG tablet Take 1 tablet (500 mg total) by mouth 2 (two) times daily with a meal.  60 tablet  6  . Naproxen Sodium (ALEVE PO) Take by mouth.      [place] OVER THE COUNTER MEDICATION Vitamin B6 taken twice a day      . tiZANidine (ZANAFLEX) 4 MG capsule Take 1 capsule (4 mg total) by mouth at bedtime as needed for muscle spasms.  30 capsule  0  . traMADol (ULTRAM) 50 MG tablet Take 1 tablet (50 mg total) by mouth every 12 (twelve) hours as needed.  60 tablet  5  . Multiple Vitamin  (MULTIVITAMIN) tablet Take 1 tablet by mouth daily.      Marland Kitchen OVER THE COUNTER MEDICATION Ginko Baloba taking one tablet daily      . OVER THE COUNTER MEDICATION Capsium patches prn       No current facility-administered medications for this visit.   History   Social History  . Marital Status: Married    Spouse Name: N/A    Number of Children: N/A  . Years of Education: N/A   Occupational History  . teacher    Social History Main Topics  . Smoking status: Former Research scientist (life sciences)  . Smokeless tobacco: Never Used  . Alcohol Use: 0.5 - 1.0 oz/week    1-2 drink(s) per week     Comment: drink daily-2 drinks  . Drug Use: No  . Sexual Activity: Yes    Birth Control/ Protection: None     Comment: number of sex partners in the last 66 months 1   Other Topics Concern  . Not on file   Social History Narrative   Marital status: married x 17 years.      Children: 2 children (56 yo daughter 49 yo son)      Lives: with wife, 2 children, 2 cats.      Employment: Marketing executive.  Teaching x 11 years.      Tobacco:  None       Alcohol:  2 drinks per day at most.      Exercise stretches 3 x per week for 30-45 minutes   Family History  Problem Relation Age of Onset  . Adopted: Yes       Objective:    Triage Vitals: BP 130/80  Pulse 69  Temp(Src) 98.8 F (37.1 C) (Oral)  Resp 16  Ht 6' (1.829 m)  Wt 289 lb 3.2 oz (131.18 kg)  BMI 39.21 kg/m2  SpO2 98% Physical Exam  Nursing note and vitals reviewed. Constitutional: He is oriented to person, place, and time. He appears well-developed and well-nourished. No distress.  HENT:  Head: Normocephalic and atraumatic.  Eyes: Conjunctivae and EOM are normal. Pupils are equal, round, and reactive to light.  Neck: Normal range of motion. Neck supple. Carotid bruit is not present. No thyromegaly present.  Cardiovascular: Normal rate, regular rhythm, normal heart sounds and intact distal pulses.  Exam reveals no gallop and no friction  rub.   No murmur heard. Pulmonary/Chest: Effort normal and breath sounds normal. He has no wheezes. He has no rales.  Musculoskeletal: Normal range of motion.  Full ROM lumbar spine without limitation Toe and heel walking normal Marching normal Straight leg raises negative  Lymphadenopathy:  He has no cervical adenopathy.  Neurological: He is alert and oriented to person, place, and time. He has normal reflexes. No cranial nerve deficit.  Skin: Skin is warm and dry. No rash noted. He is not diaphoretic.  Psychiatric: He has a normal mood and affect. His behavior is normal.       Assessment & Plan:   1. Chronic lower back pain   2. Pure hypercholesterolemia   3. Frequent falls   4. Establishing care with new doctor, encounter for    1. Chronic lower back pain: stable at this time without worsening in past year.  S/p physical therapy in past.  S/p two diskectomies in past 2004 and 2010.  Good range of motion of lumbar spine. Refill of Tramadol provided.  Recommend weight loss and regular exercise. 2.  Hypercholesterolemia: stable; will return in one month for fasting labs. 3.  Frequent falls:  New.    Normal neurological exam and lumbar spine exam.  Most consistent with obesity and deconditioning. Recommend weight loss and regular exercise and core strengthening. Monitor closely over the next six to twelve months.  Returning also in two weeks for complete physical examination. 4. Establish care: reviewed medical history in detail during visit.  Meds ordered this encounter  Medications  . traMADol (ULTRAM) 50 MG tablet    Sig: Take 1 tablet (50 mg total) by mouth every 12 (twelve) hours as needed.    Dispense:  60 tablet    Refill:  5    Return in about 2 weeks (around 04/19/2014) for complete physical examiniation.   I personally performed the services described in this documentation, which was scribed in my presence.  The recorded information has been reviewed and is  accurate.  Reginia Forts, M.D.  [facility] [address] [place], [place]  [phone] phone [phone] fax

## 2014-04-12 ENCOUNTER — Encounter: Payer: BC Managed Care – PPO | Admitting: Family Medicine

## 2014-05-03 ENCOUNTER — Encounter: Payer: Self-pay | Admitting: Family Medicine

## 2014-05-03 ENCOUNTER — Ambulatory Visit (INDEPENDENT_AMBULATORY_CARE_PROVIDER_SITE_OTHER): Payer: BC Managed Care – PPO | Admitting: Family Medicine

## 2014-05-03 VITALS — BP 127/81 | HR 63 | Temp 98.2°F | Resp 16 | Ht 72.0 in | Wt 286.0 lb

## 2014-05-03 DIAGNOSIS — Z23 Encounter for immunization: Secondary | ICD-10-CM

## 2014-05-03 DIAGNOSIS — Z0000 Encounter for general adult medical examination without abnormal findings: Secondary | ICD-10-CM

## 2014-05-03 DIAGNOSIS — E669 Obesity, unspecified: Secondary | ICD-10-CM

## 2014-05-03 DIAGNOSIS — M545 Low back pain, unspecified: Secondary | ICD-10-CM

## 2014-05-03 DIAGNOSIS — E785 Hyperlipidemia, unspecified: Secondary | ICD-10-CM

## 2014-05-03 DIAGNOSIS — Z125 Encounter for screening for malignant neoplasm of prostate: Secondary | ICD-10-CM

## 2014-05-03 LAB — CBC WITH DIFFERENTIAL/PLATELET
BASOS ABS: 0.1 10*3/uL (ref 0.0–0.1)
Basophils Relative: 1 % (ref 0–1)
EOS PCT: 3 % (ref 0–5)
Eosinophils Absolute: 0.3 10*3/uL (ref 0.0–0.7)
HCT: 48.1 % (ref 39.0–52.0)
Hemoglobin: 16.5 g/dL (ref 13.0–17.0)
LYMPHS ABS: 2.4 10*3/uL (ref 0.7–4.0)
LYMPHS PCT: 28 % (ref 12–46)
MCH: 32.1 pg (ref 26.0–34.0)
MCHC: 34.3 g/dL (ref 30.0–36.0)
MCV: 93.6 fL (ref 78.0–100.0)
Monocytes Absolute: 0.9 10*3/uL (ref 0.1–1.0)
Monocytes Relative: 10 % (ref 3–12)
NEUTROS ABS: 5 10*3/uL (ref 1.7–7.7)
Neutrophils Relative %: 58 % (ref 43–77)
PLATELETS: 261 10*3/uL (ref 150–400)
RBC: 5.14 MIL/uL (ref 4.22–5.81)
RDW: 13.6 % (ref 11.5–15.5)
WBC: 8.6 10*3/uL (ref 4.0–10.5)

## 2014-05-03 LAB — POCT URINALYSIS DIPSTICK
BILIRUBIN UA: NEGATIVE
Blood, UA: NEGATIVE
GLUCOSE UA: NEGATIVE
LEUKOCYTES UA: NEGATIVE
Nitrite, UA: NEGATIVE
Protein, UA: NEGATIVE
SPEC GRAV UA: 1.02
Urobilinogen, UA: 0.2
pH, UA: 5

## 2014-05-03 LAB — COMPLETE METABOLIC PANEL WITH GFR
ALT: 49 U/L (ref 0–53)
AST: 28 U/L (ref 0–37)
Albumin: 4.6 g/dL (ref 3.5–5.2)
Alkaline Phosphatase: 73 U/L (ref 39–117)
BUN: 12 mg/dL (ref 6–23)
CALCIUM: 9.8 mg/dL (ref 8.4–10.5)
CO2: 28 meq/L (ref 19–32)
CREATININE: 0.95 mg/dL (ref 0.50–1.35)
Chloride: 103 mEq/L (ref 96–112)
GFR, Est Non African American: >89 mL/min
Glucose, Bld: 82 mg/dL (ref 70–99)
Potassium: 5.3 mEq/L (ref 3.5–5.3)
Sodium: 138 mEq/L (ref 135–145)
Total Bilirubin: 0.7 mg/dL (ref 0.2–1.2)
Total Protein: 7.3 g/dL (ref 6.0–8.3)

## 2014-05-03 LAB — LIPID PANEL
CHOL/HDL RATIO: 3 ratio
Cholesterol: 149 mg/dL (ref 0–200)
HDL: 49 mg/dL (ref >39)
LDL Cholesterol: 83 mg/dL (ref 0–99)
Triglycerides: 83 mg/dL (ref <150)
VLDL: 17 mg/dL (ref 0–40)

## 2014-05-03 LAB — TSH: TSH: 2.487 u[IU]/mL (ref 0.350–4.500)

## 2014-05-03 MED ORDER — ATORVASTATIN CALCIUM 10 MG PO TABS
10.0000 mg | ORAL_TABLET | Freq: Every day | ORAL | Status: DC
Start: 2014-05-03 — End: 2015-04-25

## 2014-05-03 MED ORDER — CYCLOBENZAPRINE HCL 5 MG PO TABS: 5.0000 mg | ORAL_TABLET | Freq: Three times a day (TID) | ORAL | Status: DC | PRN

## 2014-05-03 MED ORDER — NAPROXEN 500 MG PO TABS: 500.0000 mg | ORAL_TABLET | Freq: Two times a day (BID) | ORAL | Status: DC

## 2014-05-03 NOTE — Patient Instructions (Signed)

## 2014-05-03 NOTE — Progress Notes (Addendum)
Subjective:  This chart was scribed for Nathan Honour, MD by [name], ED Scribe. This patient's care was started at 3:04 PM.    Patient ID: Nathan Holder, male    DOB: [birth year], 43 y.o.   MRN: [id number]  05/03/2014  Annual Exam and Hyperlipidemia  HPI HPI Comments: Nathan Holder is a 43 y.o. male with a history of hypercholesterolemia and chronic lower back pain who presents for his annual physical exam. Pt denies any known, current allergies. Pt states he has daily BM and denies any episodes of unexpected diarrhea or changes to his urine stream. Pt has acid reflux occasionally when he eats greasy foods. Pt states that his sex drive is normal and denies any problems with penile erections. Pt currently sleeps between 7-8 hours per night and states he wakes up feeling energized. Pt snores, but denies taking part in any prior sleep study. He does not wake up during the night to urinate. Pt also states that he feels like his "gut falls asleep" around where his belt sits on the L; intermittent issue. He discussed this with Christell Faith at his last visit and states that it has happened less frequently lately.    Pt is currently feeling well emotionally, but had a "bummer summer" in 2014. He states this may have been because his family did not go anywhere or do anything special this summer. Pt states that was a short-lived mentality and was relieved by staying busy and building a regular schedule this past summer. Pt collects clocks and watches, hunts and fishes, and builds car models.   Pt's family is in [place]. Pt was adopted and has no known biological connections. Pt is currently a Freight forwarder at [employer]. Pt has been married 42 years old and has an 65 y/o son and 76 y/o daughter. Pt has 2 cats at home. Pt drinks 2 drinks of EtOH daily. He states that he chewed tobacco for 5 years, but denies any recent use. He has several guns in the home and states that the majority are not  loaded. Pt states that guns are secured and out of reach of his children. Pt wears seatbelt in the car every time.   Pt has tried Atkin's, but states that the diet becomes boring. He discussed desire to lose weight. Prior to his last trip to [place], he was 270 lbs. His in-office weight is 286.   Pt will be receiving his influenza vaccination next week. Pt will get TDAP today. Pt had appendicitis and 2 microdiskectomies in 2004 and 2010 involving L4 and L5.  Pt reports taking Albuterol 2x per month, Lipitor daily, Flexeril daily, 3 capsules of Fish Oil daily, flax seed occasionally, Naproxen once daily, 2 capsules of Vitamin B6 each morning, and Tramadol regularly. Pt has been on Lipitor for 1 year. Pt's last eye exam was in January and visits dentist as needed. Pt does not know of any changes in moles on back.    Last physical: 05/06/2012 Colonoscopy: never TDAP: 08/06/2002 Influenza:  Eye exam: January 2015 Dental exam: none recently. Hepatitis: 2004  Review of Systems  Constitutional: Negative for fever, chills, diaphoresis, activity change, appetite change, fatigue and unexpected weight change.  HENT: Negative for congestion, dental problem, drooling, ear discharge, ear pain, facial swelling, hearing loss, mouth sores, nosebleeds, postnasal drip, rhinorrhea, sinus pressure, sneezing, sore throat, tinnitus, trouble swallowing and voice change.   Eyes: Negative for photophobia, pain, discharge, redness, itching and visual disturbance.  Respiratory: Negative for apnea, cough, choking, chest tightness, shortness of breath, wheezing and stridor.   Cardiovascular: Negative for chest pain, palpitations and leg swelling.  Gastrointestinal: Negative for nausea, vomiting, abdominal pain, diarrhea, constipation and blood in stool.  Endocrine: Negative for cold intolerance, heat intolerance, polydipsia, polyphagia and polyuria.  Genitourinary: Negative for dysuria, urgency, frequency, hematuria, flank  pain, decreased urine volume, discharge, penile swelling, scrotal swelling, enuresis, difficulty urinating, genital sores, penile pain and testicular pain.       No problems with penile erection, sex drive is normal  Musculoskeletal: Positive for back pain (chronic, unchanged). Negative for arthralgias, gait problem, joint swelling, myalgias, neck pain and neck stiffness.  Skin: Negative for color change, pallor, rash and wound.  Allergic/Immunologic: Negative.  Negative for environmental allergies, food allergies and immunocompromised state.  Neurological: Positive for numbness. Negative for dizziness, tremors, seizures, syncope, facial asymmetry, speech difficulty, weakness, light-headedness and headaches.  Hematological: Negative for adenopathy. Does not bruise/bleed easily.  Psychiatric/Behavioral: Negative for suicidal ideas, hallucinations, behavioral problems, confusion, sleep disturbance, self-injury, dysphoric mood, decreased concentration and agitation. The patient is not nervous/anxious and is not hyperactive.   All other systems reviewed and are negative.   Past Medical History  Diagnosis Date  . Back pain   . Allergy     mold pollen  . Asthma     very mild   Past Surgical History  Procedure Laterality Date  . Appendectomy    . Back surgery    . Spine surgery  08/06/2008    diskectomy L4-5 2010, 2004.   No Known Allergies Current Outpatient Prescriptions  Medication Sig Dispense Refill  . albuterol (VENTOLIN HFA) 108 (90 BASE) MCG/ACT inhaler Inhale 2 puffs into the lungs every 4 (four) hours as needed for wheezing.  1 Inhaler  2  . atorvastatin (LIPITOR) 10 MG tablet Take 1 tablet (10 mg total) by mouth daily.  90 tablet  3  . cyclobenzaprine (FLEXERIL) 5 MG tablet Take 1 tablet (5 mg total) by mouth 3 (three) times daily as needed for muscle spasms.  90 tablet  6  . fish oil-omega-3 fatty acids 1000 MG capsule Take 2 g by mouth daily.      . Flaxseed, Linseed, (FLAX SEEDS  PO) Take by mouth.      . naproxen (NAPROSYN) 500 MG tablet Take 1 tablet (500 mg total) by mouth 2 (two) times daily with a meal.  90 tablet  3  . OVER THE COUNTER MEDICATION Vitamin B6 taken twice a day      . traMADol (ULTRAM) 50 MG tablet Take 1 tablet (50 mg total) by mouth every 12 (twelve) hours as needed.  60 tablet  5   No current facility-administered medications for this visit.   History   Social History  . Marital Status: Married    Spouse Name: N/A    Number of Children: N/A  . Years of Education: N/A   Occupational History  . teacher    Social History Main Topics  . Smoking status: Former Research scientist (life sciences)  . Smokeless tobacco: Never Used  . Alcohol Use: 0.5 - 1.0 oz/week    1-2 drink(s) per week     Comment: drink daily-2 drinks, per Health Survey (05/03/14)  10-16 drinks  . Drug Use: No  . Sexual Activity: Yes    Birth Control/ Protection: None     Comment: number of sex partners in the last 43 months 1   Other Topics Concern  . Not on  file   Social History Narrative   Marital status: married x 17 years.      Children: 2 children (35 yo daughter 43 yo son)      Lives: with wife, 2 children, 2 cats.      Employment: Marketing executive.  Teaching x 11 years.      Tobacco:  None       Alcohol:  2 drinks per day at most.      Exercise stretches 3 x per week for 30-45 minutes      Seatbelt: 100%      Guns:  Guns lots of them; concealed carrier permit; loaded but secured.     Family History  Problem Relation Age of Onset  . Adopted: Yes        Objective:    BP 127/81  Pulse 63  Temp(Src) 98.2 F (36.8 C) (Oral)  Resp 16  Ht 6' (1.829 m)  Wt 286 lb (129.729 kg)  BMI 38.78 kg/m2  SpO2 99% Physical Exam  Constitutional: He is oriented to person, place, and time. He appears well-developed and well-nourished. No distress.  obese  HENT:  Head: Normocephalic and atraumatic.  Right Ear: External ear normal.  Left Ear: External ear normal.  Nose:  Nose normal.  Mouth/Throat: Oropharynx is clear and moist.  Eyes: Conjunctivae and EOM are normal. Pupils are equal, round, and reactive to light.  Neck: Normal range of motion. Neck supple. Carotid bruit is not present. No tracheal deviation present. No thyromegaly present.  Cardiovascular: Normal rate, regular rhythm, normal heart sounds and intact distal pulses.  Exam reveals no gallop and no friction rub.   No murmur heard. Pulmonary/Chest: Effort normal and breath sounds normal. No respiratory distress. He has no wheezes. He has no rales.  Abdominal: Soft. Bowel sounds are normal. He exhibits no distension and no mass. There is no tenderness. There is no rebound and no guarding. Hernia confirmed negative in the right inguinal area and confirmed negative in the left inguinal area.  Genitourinary: Testes normal and penis normal. Right testis shows no mass, no swelling and no tenderness. Left testis shows no mass, no swelling and no tenderness. Circumcised.  Musculoskeletal: Normal range of motion.       Right shoulder: Normal.       Left shoulder: Normal.       Cervical back: Normal.  Lymphadenopathy:    He has no cervical adenopathy.       Right: No inguinal adenopathy present.       Left: No inguinal adenopathy present.  Neurological: He is alert and oriented to person, place, and time. He has normal reflexes. No cranial nerve deficit. He exhibits normal muscle tone. Coordination normal.  Skin: Skin is warm and dry. No rash noted. He is not diaphoretic.  2 regular nevi along upper back; no color variations.  Psychiatric: He has a normal mood and affect. His behavior is normal. Judgment and thought content normal.   Results for orders placed in visit on 05/03/14  CBC WITH DIFFERENTIAL      Result Value Ref Range   WBC 8.6  4.0 - 10.5 K/uL   RBC 5.14  4.22 - 5.81 MIL/uL   Hemoglobin 16.5  13.0 - 17.0 g/dL   HCT 48.1  39.0 - 52.0 %   MCV 93.6  78.0 - 100.0 fL   MCH 32.1  26.0 - 34.0  pg   MCHC 34.3  30.0 - 36.0 g/dL   RDW  13.6  11.5 - 15.5 %   Platelets 261  150 - 400 K/uL   Neutrophils Relative % 58  43 - 77 %   Neutro Abs 5.0  1.7 - 7.7 K/uL   Lymphocytes Relative 28  12 - 46 %   Lymphs Abs 2.4  0.7 - 4.0 K/uL   Monocytes Relative 10  3 - 12 %   Monocytes Absolute 0.9  0.1 - 1.0 K/uL   Eosinophils Relative 3  0 - 5 %   Eosinophils Absolute 0.3  0.0 - 0.7 K/uL   Basophils Relative 1  0 - 1 %   Basophils Absolute 0.1  0.0 - 0.1 K/uL   Smear Review Criteria for review not met    COMPLETE METABOLIC PANEL WITH GFR      Result Value Ref Range   Sodium 138  135 - 145 mEq/L   Potassium 5.3  3.5 - 5.3 mEq/L   Chloride 103  96 - 112 mEq/L   CO2 28  19 - 32 mEq/L   Glucose, Bld 82  70 - 99 mg/dL   BUN 12  6 - 23 mg/dL   Creat 0.95  0.50 - 1.35 mg/dL   Total Bilirubin 0.7  0.2 - 1.2 mg/dL   Alkaline Phosphatase 73  39 - 117 U/L   AST 28  0 - 37 U/L   ALT 49  0 - 53 U/L   Total Protein 7.3  6.0 - 8.3 g/dL   Albumin 4.6  3.5 - 5.2 g/dL   Calcium 9.8  8.4 - 10.5 mg/dL   GFR, Est African American >89     GFR, Est Non African American >89    HEMOGLOBIN A1C      Result Value Ref Range   Hemoglobin A1C 5.6  <5.7 %   Mean Plasma Glucose 114  <117 mg/dL  LIPID PANEL      Result Value Ref Range   Cholesterol 149  0 - 200 mg/dL   Triglycerides 83  <150 mg/dL   HDL 49  >39 mg/dL   Total CHOL/HDL Ratio 3.0     VLDL 17  0 - 40 mg/dL   LDL Cholesterol 83  0 - 99 mg/dL  TSH      Result Value Ref Range   TSH 2.487  0.350 - 4.500 uIU/mL  PSA      Result Value Ref Range   PSA 0.72  <=4.00 ng/mL  POCT URINALYSIS DIPSTICK      Result Value Ref Range   Color, UA yellow     Clarity, UA clear     Glucose, UA neg     Bilirubin, UA neg     Ketones, UA trace     Spec Grav, UA 1.020     Blood, UA neg     pH, UA 5.0     Protein, UA neg     Urobilinogen, UA 0.2     Nitrite, UA neg     Leukocytes, UA Negative     TDAP ADMINISTERED.    Assessment & Plan:   1. Routine  general medical examination at a health care facility   2. Other and unspecified hyperlipidemia   3. Low back pain without sciatica, unspecified back pain laterality   4. Need for prophylactic vaccination with combined diphtheria-tetanus-pertussis (DTP) vaccine   5. Screening for prostate cancer   6. Obesity, unspecified    1. Complete Physical Examination:  Anticipatory guidance provided --- weight loss, exercise.  S/p TDAP in office.   2.  Hyperlipidemia: controlled; obtain labs; refill provided. 3.  Lower back pain chronic: stable; refills provided; follow-up in six months. 4.  Prostate cancer screening: obtain PSA: pt declined prostate exam/DRE. 5.  S/p TDAP. 6. Obesity: discussed at length; recommend [employer] or [facility]; recommend exercise such as elliptical or biking.   Meds ordered this encounter  Medications  . atorvastatin (LIPITOR) 10 MG tablet    Sig: Take 1 tablet (10 mg total) by mouth daily.    Dispense:  90 tablet    Refill:  3  . cyclobenzaprine (FLEXERIL) 5 MG tablet    Sig: Take 1 tablet (5 mg total) by mouth 3 (three) times daily as needed for muscle spasms.    Dispense:  90 tablet    Refill:  6  . naproxen (NAPROSYN) 500 MG tablet    Sig: Take 1 tablet (500 mg total) by mouth 2 (two) times daily with a meal.    Dispense:  90 tablet    Refill:  3    Please place on hold for patient    Return in about 6 months (around 11/01/2014) for recheck high cholesterol, low back pain.  3:10 PM-Discussed out-of-date TDAP and benefits, prostate cancer screening, and weight loss methods.   I personally performed the services described in this documentation, which was scribed in my presence.  The recorded information has been reviewed and is accurate.  Reginia Forts, M.D.  [facility] [address] [place], [place]  [address] [phone] phone [phone] fax

## 2014-05-04 LAB — HEMOGLOBIN A1C
Hgb A1c MFr Bld: 5.6 % (ref <5.7)
MEAN PLASMA GLUCOSE: 114 mg/dL (ref <117)

## 2014-05-04 LAB — PSA: PSA: 0.72 ng/mL (ref <=4.00)

## 2014-09-10 ENCOUNTER — Ambulatory Visit (INDEPENDENT_AMBULATORY_CARE_PROVIDER_SITE_OTHER): Payer: BC Managed Care – PPO | Admitting: Family Medicine

## 2014-09-10 VITALS — BP 130/80 | HR 76 | Temp 99.0°F | Resp 16 | Ht 72.0 in | Wt 297.0 lb

## 2014-09-10 DIAGNOSIS — N471 Phimosis: Secondary | ICD-10-CM

## 2014-09-10 MED ORDER — DOXYCYCLINE HYCLATE 100 MG PO TABS: 100.0000 mg | ORAL_TABLET | Freq: Two times a day (BID) | ORAL | Status: DC

## 2014-09-10 MED ORDER — NYSTATIN-TRIAMCINOLONE 100000-0.1 UNIT/GM-% EX OINT: 1.0000 "application " | TOPICAL_OINTMENT | Freq: Two times a day (BID) | CUTANEOUS | Status: DC

## 2014-09-10 NOTE — Progress Notes (Signed)
° °  Subjective:    Patient ID: Nathan Holder, male    DOB: 1970/12/11, 44 y.o.   MRN: [id number] This chart was scribed for Robyn Haber, MD by Zola Button, Medical Scribe. This patient was seen in Room 1 and the patient's care was started at 8:57 AM.   HPI HPI Comments: Nathan Holder is a 44 y.o. male who presents to the [facility] complaining of gradual onset, progressively worsening penile pain with swelling that started 3-4 days ago. He states that there are channels behind the penis head. Patient had a circumcision as an infant; he believes he either had a bad circumcision or bad follow-up care for the circumcision. He denies having similar symptoms in the past. Patient has seen a specialist 2 years ago, but was advised against any procedures.  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  PCP: Dr. Reginia Forts  Review of Systems  Genitourinary: Positive for penile swelling and penile pain.       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 The foreskin remaining is swollen, red and tender. 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 Because the urologist is reluctant to remove the foreskin, I think the only strategy available is antibiotic care when this flares up.  This chart was scribed in my presence and reviewed by me personally.    ICD-9-CM ICD-10-CM   1. Phimosis 605 N47.1 nystatin-triamcinolone ointment (MYCOLOG)     doxycycline (VIBRA-TABS) 100 MG tablet     DISCONTINUED: doxycycline (VIBRA-TABS) 100 MG tablet     Signed, Robyn Haber, MD

## 2014-10-18 ENCOUNTER — Ambulatory Visit (INDEPENDENT_AMBULATORY_CARE_PROVIDER_SITE_OTHER): Payer: BC Managed Care – PPO | Admitting: Family Medicine

## 2014-10-18 ENCOUNTER — Ambulatory Visit: Payer: BC Managed Care – PPO | Admitting: Family Medicine

## 2014-10-18 ENCOUNTER — Encounter: Payer: Self-pay | Admitting: Family Medicine

## 2014-10-18 VITALS — BP 138/80 | HR 84 | Temp 98.8°F | Resp 16 | Ht 72.0 in | Wt 296.8 lb

## 2014-10-18 DIAGNOSIS — E7800 Pure hypercholesterolemia, unspecified: Secondary | ICD-10-CM

## 2014-10-18 DIAGNOSIS — G8929 Other chronic pain: Secondary | ICD-10-CM | POA: Diagnosis not present

## 2014-10-18 DIAGNOSIS — E669 Obesity, unspecified: Secondary | ICD-10-CM

## 2014-10-18 DIAGNOSIS — N471 Phimosis: Secondary | ICD-10-CM

## 2014-10-18 DIAGNOSIS — M545 Low back pain: Secondary | ICD-10-CM | POA: Diagnosis not present

## 2014-10-18 LAB — CBC WITH DIFFERENTIAL/PLATELET
BASOS PCT: 1 % (ref 0–1)
Basophils Absolute: 0.1 10*3/uL (ref 0.0–0.1)
EOS ABS: 0.2 10*3/uL (ref 0.0–0.7)
Eosinophils Relative: 2 % (ref 0–5)
HEMATOCRIT: 46.8 % (ref 39.0–52.0)
Hemoglobin: 16.1 g/dL (ref 13.0–17.0)
LYMPHS ABS: 2.6 10*3/uL (ref 0.7–4.0)
Lymphocytes Relative: 27 % (ref 12–46)
MCH: 31.9 pg (ref 26.0–34.0)
MCHC: 34.4 g/dL (ref 30.0–36.0)
MCV: 92.7 fL (ref 78.0–100.0)
MPV: 10.5 fL (ref 8.6–12.4)
Monocytes Absolute: 0.9 10*3/uL (ref 0.1–1.0)
Monocytes Relative: 9 % (ref 3–12)
NEUTROS PCT: 61 % (ref 43–77)
Neutro Abs: 5.8 10*3/uL (ref 1.7–7.7)
Platelets: 294 10*3/uL (ref 150–400)
RBC: 5.05 MIL/uL (ref 4.22–5.81)
RDW: 13.5 % (ref 11.5–15.5)
WBC: 9.5 10*3/uL (ref 4.0–10.5)

## 2014-10-18 LAB — COMPREHENSIVE METABOLIC PANEL
ALBUMIN: 4.8 g/dL (ref 3.5–5.2)
ALT: 57 U/L — ABNORMAL HIGH (ref 0–53)
AST: 27 U/L (ref 0–37)
Alkaline Phosphatase: 60 U/L (ref 39–117)
BUN: 17 mg/dL (ref 6–23)
CHLORIDE: 100 meq/L (ref 96–112)
CO2: 25 meq/L (ref 19–32)
Calcium: 9.7 mg/dL (ref 8.4–10.5)
Creat: 1.08 mg/dL (ref 0.50–1.35)
GLUCOSE: 79 mg/dL (ref 70–99)
POTASSIUM: 4.4 meq/L (ref 3.5–5.3)
Sodium: 138 mEq/L (ref 135–145)
TOTAL PROTEIN: 7.3 g/dL (ref 6.0–8.3)
Total Bilirubin: 0.8 mg/dL (ref 0.2–1.2)

## 2014-10-18 LAB — LIPID PANEL
CHOL/HDL RATIO: 3.6 ratio
CHOLESTEROL: 173 mg/dL (ref 0–200)
HDL: 48 mg/dL (ref >=40)
LDL CALC: 89 mg/dL (ref 0–99)
Triglycerides: 182 mg/dL — ABNORMAL HIGH (ref <150)
VLDL: 36 mg/dL (ref 0–40)

## 2014-10-18 MED ORDER — TRAMADOL HCL 50 MG PO TABS: 50.0000 mg | ORAL_TABLET | Freq: Two times a day (BID) | ORAL | Status: DC | PRN

## 2014-10-18 NOTE — Patient Instructions (Signed)

## 2014-10-18 NOTE — Progress Notes (Signed)
Subjective:    Patient ID: Nathan Holder, male    DOB: 14-Apr-1971, 44 y.o.   MRN: [id number]  10/18/2014  Follow-up; Back Pain; Hypertension; and Medication Refill   HPI This 44 y.o. male presents for six month follow-up:  1.  Hypercholesterolemia:  Patient reports good compliance with medication, good tolerance to medication, and good symptom control.   No changes to management made at last visit.  2.  Chronic lower back pain:  Two really bad days; has a TENS unit for the really bad days.  Tramadol is a daily thing; usually uses once per day but can need up to 3 times per day.  Flexeril with Tramadol.  Takes Naproxen daily.    3.  Phimosis:  Improved with treatment.  4. Obesity: no weight loss since last visit.  5.  Anxiety:  Onset in past two weeks.  Very list oriented person.  Getting more anxious about list now.  No palpitations, shortness of breath. True anxious.  Very new.  Not daily.  Has occurred three times in past three weeks.  Duration 3 minutes.  Concerned about panic attacks.    Review of Systems  Constitutional: Negative for fever, chills, diaphoresis, activity change, appetite change and fatigue.  Eyes: Negative for visual disturbance.  Respiratory: Negative for cough and shortness of breath.   Cardiovascular: Negative for chest pain, palpitations and leg swelling.  Endocrine: Negative for cold intolerance, heat intolerance, polydipsia, polyphagia and polyuria.  Musculoskeletal: Positive for myalgias and back pain.  Neurological: Negative for dizziness, tremors, seizures, syncope, facial asymmetry, speech difficulty, weakness, light-headedness, numbness and headaches.  Psychiatric/Behavioral: Negative for suicidal ideas, sleep disturbance, self-injury and dysphoric mood. The patient is nervous/anxious.     Past Medical History  Diagnosis Date  . Back pain   . Allergy     mold pollen  . Asthma     very mild   Past Surgical History  Procedure Laterality  Date  . Appendectomy    . Back surgery    . Spine surgery  08/06/2008    diskectomy L4-5 2010, 2004.   No Known Allergies Current Outpatient Prescriptions  Medication Sig Dispense Refill  . albuterol (VENTOLIN HFA) 108 (90 BASE) MCG/ACT inhaler Inhale 2 puffs into the lungs every 4 (four) hours as needed for wheezing. 1 Inhaler 2  . atorvastatin (LIPITOR) 10 MG tablet Take 1 tablet (10 mg total) by mouth daily. 90 tablet 3  . cyclobenzaprine (FLEXERIL) 5 MG tablet Take 1 tablet (5 mg total) by mouth 3 (three) times daily as needed for muscle spasms. 90 tablet 6  . fish oil-omega-3 fatty acids 1000 MG capsule Take 2 g by mouth daily.    . Flaxseed, Linseed, (FLAX SEEDS PO) Take by mouth.    . naproxen (NAPROSYN) 500 MG tablet Take 1 tablet (500 mg total) by mouth 2 (two) times daily with a meal. 90 tablet 3  . nystatin-triamcinolone ointment (MYCOLOG) Apply 1 application topically 2 (two) times daily. 30 g 3  . OVER THE COUNTER MEDICATION Vitamin B6 taken twice a day    . traMADol (ULTRAM) 50 MG tablet Take 1 tablet (50 mg total) by mouth every 12 (twelve) hours as needed. 60 tablet 5   No current facility-administered medications for this visit.       Objective:    BP 138/80 mmHg  Pulse 84  Temp(Src) 98.8 F (37.1 C) (Oral)  Resp 16  Ht 6' (1.829 m)  Wt 296 lb 12.8 oz (  134.628 kg)  BMI 40.24 kg/m2  SpO2 98% Physical Exam  Constitutional: He is oriented to person, place, and time. He appears well-developed and well-nourished. No distress.  obese  HENT:  Head: Normocephalic and atraumatic.  Right Ear: External ear normal.  Left Ear: External ear normal.  Nose: Nose normal.  Mouth/Throat: Oropharynx is clear and moist.  Eyes: Conjunctivae and EOM are normal. Pupils are equal, round, and reactive to light.  Neck: Normal range of motion. Neck supple. Carotid bruit is not present. No thyromegaly present.  Cardiovascular: Normal rate, regular rhythm, normal heart sounds and intact  distal pulses.  Exam reveals no gallop and no friction rub.   No murmur heard. Pulmonary/Chest: Effort normal and breath sounds normal. He has no wheezes. He has no rales.  Abdominal: Soft. Bowel sounds are normal. He exhibits no distension and no mass. There is no tenderness. There is no rebound and no guarding.  Musculoskeletal:       Lumbar back: He exhibits pain. He exhibits normal range of motion, no tenderness, no bony tenderness and no spasm.  Lumbar spine:  Non-tender midline; non-tender paraspinal regions B.  Straight leg raises negative B; toe and heel walking intact; marching intact; motor 5/5 BLE.  Full ROM lumbar spine without limitation.   Lymphadenopathy:    He has no cervical adenopathy.  Neurological: He is alert and oriented to person, place, and time. No cranial nerve deficit.  Skin: Skin is warm and dry. No rash noted. He is not diaphoretic.  Psychiatric: He has a normal mood and affect. His behavior is normal.  Nursing note and vitals reviewed.       Assessment & Plan:   1. Pure hypercholesterolemia   2. Chronic lower back pain   3. Obesity (BMI 30-39.9)   4. Phimosis     1. Hypercholesterolemia: controlled; obtain labs; continue current medications. 2.  Chronic lower back pain: stable; refill of Tramadol provided; recommend weight loss and regular exercise. 3.  Obesity: unchanged; highly recommend weight loss, exercise, low-fat food choices. 4.  Phimosis: resolved/improved.   Meds ordered this encounter  Medications  . traMADol (ULTRAM) 50 MG tablet    Sig: Take 1 tablet (50 mg total) by mouth every 12 (twelve) hours as needed.    Dispense:  60 tablet    Refill:  5    Return in about 6 months (around 04/20/2015) for complete physical examiniation.     Doniven Vanpatten Elayne Guerin, M.D. [facility] [address] [place], [place]  [address] [phone] phone [phone] fax

## 2014-10-25 ENCOUNTER — Ambulatory Visit: Payer: BC Managed Care – PPO | Admitting: Family Medicine

## 2015-01-06 ENCOUNTER — Other Ambulatory Visit: Payer: Self-pay | Admitting: Family Medicine

## 2015-01-17 ENCOUNTER — Other Ambulatory Visit: Payer: Self-pay | Admitting: Family Medicine

## 2015-03-07 ENCOUNTER — Ambulatory Visit: Payer: BC Managed Care – PPO | Admitting: Family Medicine

## 2015-04-25 ENCOUNTER — Ambulatory Visit (INDEPENDENT_AMBULATORY_CARE_PROVIDER_SITE_OTHER): Payer: BC Managed Care – PPO | Admitting: Family Medicine

## 2015-04-25 ENCOUNTER — Encounter: Payer: Self-pay | Admitting: Family Medicine

## 2015-04-25 VITALS — BP 132/90 | HR 77 | Temp 98.2°F | Resp 16 | Ht 72.0 in | Wt 310.0 lb

## 2015-04-25 DIAGNOSIS — Z0000 Encounter for general adult medical examination without abnormal findings: Secondary | ICD-10-CM | POA: Diagnosis not present

## 2015-04-25 DIAGNOSIS — Z131 Encounter for screening for diabetes mellitus: Secondary | ICD-10-CM | POA: Diagnosis not present

## 2015-04-25 DIAGNOSIS — J988 Other specified respiratory disorders: Secondary | ICD-10-CM

## 2015-04-25 DIAGNOSIS — E669 Obesity, unspecified: Secondary | ICD-10-CM

## 2015-04-25 DIAGNOSIS — G8929 Other chronic pain: Secondary | ICD-10-CM | POA: Diagnosis not present

## 2015-04-25 DIAGNOSIS — Z114 Encounter for screening for human immunodeficiency virus [HIV]: Secondary | ICD-10-CM

## 2015-04-25 DIAGNOSIS — E7800 Pure hypercholesterolemia, unspecified: Secondary | ICD-10-CM

## 2015-04-25 DIAGNOSIS — M5136 Other intervertebral disc degeneration, lumbar region: Secondary | ICD-10-CM

## 2015-04-25 DIAGNOSIS — Z113 Encounter for screening for infections with a predominantly sexual mode of transmission: Secondary | ICD-10-CM

## 2015-04-25 DIAGNOSIS — M545 Low back pain: Secondary | ICD-10-CM | POA: Diagnosis not present

## 2015-04-25 DIAGNOSIS — Z125 Encounter for screening for malignant neoplasm of prostate: Secondary | ICD-10-CM

## 2015-04-25 DIAGNOSIS — J22 Unspecified acute lower respiratory infection: Secondary | ICD-10-CM

## 2015-04-25 LAB — CBC WITH DIFFERENTIAL/PLATELET
Basophils Absolute: 0.1 10*3/uL (ref 0.0–0.1)
Basophils Relative: 1 % (ref 0–1)
Eosinophils Absolute: 0.4 10*3/uL (ref 0.0–0.7)
Eosinophils Relative: 4 % (ref 0–5)
HEMATOCRIT: 44.5 % (ref 39.0–52.0)
Hemoglobin: 15.4 g/dL (ref 13.0–17.0)
LYMPHS ABS: 2.2 10*3/uL (ref 0.7–4.0)
LYMPHS PCT: 23 % (ref 12–46)
MCH: 32 pg (ref 26.0–34.0)
MCHC: 34.6 g/dL (ref 30.0–36.0)
MCV: 92.5 fL (ref 78.0–100.0)
MPV: 9.9 fL (ref 8.6–12.4)
Monocytes Absolute: 1 10*3/uL (ref 0.1–1.0)
Monocytes Relative: 10 % (ref 3–12)
Neutro Abs: 6 10*3/uL (ref 1.7–7.7)
Neutrophils Relative %: 62 % (ref 43–77)
Platelets: 272 10*3/uL (ref 150–400)
RBC: 4.81 MIL/uL (ref 4.22–5.81)
RDW: 13.5 % (ref 11.5–15.5)
WBC: 9.7 10*3/uL (ref 4.0–10.5)

## 2015-04-25 LAB — COMPREHENSIVE METABOLIC PANEL
ALK PHOS: 68 U/L (ref 40–115)
ALT: 42 U/L (ref 9–46)
AST: 23 U/L (ref 10–40)
Albumin: 4.1 g/dL (ref 3.6–5.1)
BUN: 14 mg/dL (ref 7–25)
CO2: 27 mmol/L (ref 20–31)
Calcium: 9 mg/dL (ref 8.6–10.3)
Chloride: 103 mmol/L (ref 98–110)
Creat: 0.86 mg/dL (ref 0.60–1.35)
GLUCOSE: 83 mg/dL (ref 65–99)
Potassium: 4.5 mmol/L (ref 3.5–5.3)
Sodium: 143 mmol/L (ref 135–146)
Total Bilirubin: 0.8 mg/dL (ref 0.2–1.2)
Total Protein: 6.7 g/dL (ref 6.1–8.1)

## 2015-04-25 LAB — LIPID PANEL
Cholesterol: 130 mg/dL (ref 125–200)
HDL: 40 mg/dL (ref >=40)
LDL Cholesterol: 69 mg/dL (ref <130)
TRIGLYCERIDES: 107 mg/dL (ref <150)
Total CHOL/HDL Ratio: 3.3 Ratio (ref <=5.0)
VLDL: 21 mg/dL (ref <30)

## 2015-04-25 LAB — POCT URINALYSIS DIPSTICK
Bilirubin, UA: NEGATIVE
Blood, UA: NEGATIVE
GLUCOSE UA: NEGATIVE
Ketones, UA: NEGATIVE
Leukocytes, UA: NEGATIVE
Nitrite, UA: NEGATIVE
Protein, UA: NEGATIVE
SPEC GRAV UA: 1.01
Urobilinogen, UA: 1
pH, UA: 6

## 2015-04-25 LAB — TSH: TSH: 2.253 u[IU]/mL (ref 0.350–4.500)

## 2015-04-25 MED ORDER — BENZONATATE 100 MG PO CAPS: 100.0000 mg | ORAL_CAPSULE | Freq: Three times a day (TID) | ORAL | Status: DC | PRN

## 2015-04-25 MED ORDER — CYCLOBENZAPRINE HCL 5 MG PO TABS: 5.0000 mg | ORAL_TABLET | Freq: Three times a day (TID) | ORAL | Status: DC | PRN

## 2015-04-25 MED ORDER — HYDROCODONE-ACETAMINOPHEN 5-325 MG PO TABS: 1.0000 | ORAL_TABLET | Freq: Two times a day (BID) | ORAL | Status: DC | PRN

## 2015-04-25 MED ORDER — ATORVASTATIN CALCIUM 10 MG PO TABS: 10.0000 mg | ORAL_TABLET | Freq: Every day | ORAL | Status: DC

## 2015-04-25 MED ORDER — AZITHROMYCIN 250 MG PO TABS: ORAL_TABLET | ORAL | Status: DC

## 2015-04-25 MED ORDER — TRAMADOL HCL 50 MG PO TABS: 50.0000 mg | ORAL_TABLET | Freq: Two times a day (BID) | ORAL | Status: DC | PRN

## 2015-04-25 MED ORDER — MUCINEX DM 30-600 MG PO TB12: 1.0000 | ORAL_TABLET | Freq: Two times a day (BID) | ORAL | Status: DC | PRN

## 2015-04-25 MED ORDER — NAPROXEN 500 MG PO TABS: 500.0000 mg | ORAL_TABLET | Freq: Two times a day (BID) | ORAL | Status: DC

## 2015-04-25 NOTE — Progress Notes (Signed)
   Subjective:    Patient ID: Nathan Holder, male    DOB: 03/30/1971, 44 y.o.   MRN: [id number]  HPI    Review of Systems  Constitutional: Negative.   HENT: Positive for congestion and sinus pressure.   Eyes: Negative.   Respiratory: Positive for cough, shortness of breath and wheezing.   Cardiovascular: Negative.   Gastrointestinal: Negative.   Endocrine: Negative.   Genitourinary: Negative.   Musculoskeletal: Positive for back pain.  Skin: Negative.   Allergic/Immunologic: Positive for environmental allergies.  Hematological: Negative.   Psychiatric/Behavioral: Negative.        Objective:   Physical Exam        Assessment & Plan:

## 2015-04-25 NOTE — Patient Instructions (Signed)

## 2015-04-25 NOTE — Progress Notes (Signed)
Subjective:    Patient ID: Nathan Holder, male    DOB: 03-Feb-1971, 44 y.o.   MRN: [id number]  04/25/2015  Annual Exam and Cough   HPI This 44 y.o. male presents for Complete Physical Examination.  Last physical:  05-03-14 Colonoscopy:  never TDAP:  2015 Hepatitis B series: in past. Influenza: at work tomorrow Eye exam:  Due for formal eye exam.   Dental exam:  Up to date. Every six months.  BRPR once weekly; small amount on toilet paper.  Painful b.m.  Very regular b.m.  Very dependent on fiber in diet the day prior.  Went on cruise; went to [place].  Two weeks in [place]; one week in [place].    Review of Systems  Constitutional: Negative for fever, chills, diaphoresis, activity change, appetite change, fatigue and unexpected weight change.  HENT: Positive for congestion and rhinorrhea. Negative for dental problem, drooling, ear discharge, ear pain, facial swelling, hearing loss, mouth sores, nosebleeds, postnasal drip, sinus pressure, sneezing, sore throat, tinnitus, trouble swallowing and voice change.   Eyes: Negative for photophobia, pain, discharge, redness, itching and visual disturbance.  Respiratory: Positive for cough. Negative for apnea, choking, chest tightness, shortness of breath, wheezing and stridor.   Cardiovascular: Negative for chest pain, palpitations and leg swelling.  Gastrointestinal: Negative for nausea, vomiting, abdominal pain, diarrhea, constipation and blood in stool.  Endocrine: Negative for cold intolerance, heat intolerance, polydipsia, polyphagia and polyuria.  Genitourinary: Negative for dysuria, urgency, frequency, hematuria, flank pain, decreased urine volume, discharge, penile swelling, scrotal swelling, enuresis, difficulty urinating, genital sores, penile pain and testicular pain.  Musculoskeletal: Positive for back pain. Negative for myalgias, joint swelling, arthralgias, gait problem, neck pain and neck stiffness.  Skin: Negative for color  change, pallor, rash and wound.  Allergic/Immunologic: Positive for environmental allergies. Negative for food allergies and immunocompromised state.  Neurological: Negative for dizziness, tremors, seizures, syncope, facial asymmetry, speech difficulty, weakness, light-headedness, numbness and headaches.  Hematological: Negative for adenopathy. Does not bruise/bleed easily.  Psychiatric/Behavioral: Negative for suicidal ideas, hallucinations, behavioral problems, confusion, sleep disturbance, self-injury, dysphoric mood, decreased concentration and agitation. The patient is not nervous/anxious and is not hyperactive.     Past Medical History  Diagnosis Date  . Back pain   . Allergy     mold pollen  . Asthma     very mild  . Hyperlipidemia    Past Surgical History  Procedure Laterality Date  . Appendectomy    . Back surgery    . Spine surgery  08/06/2008    diskectomy L4-5 2010, 2004.   No Known Allergies  Social History   Social History  . Marital Status: Married    Spouse Name: N/A  . Number of Children: 2  . Years of Education: N/A   Occupational History  . teacher     Pre-K teacher   Social History Main Topics  . Smoking status: Former Research scientist (life sciences)  . Smokeless tobacco: Never Used  . Alcohol Use: 0.5 - 1.0 oz/week    1-2 Standard drinks or equivalent per week     Comment: drink daily-2 drinks, per Health Survey (05/03/14)  10-16 drinks  . Drug Use: No  . Sexual Activity: Yes    Birth Control/ Protection: None     Comment: number of sex partners in the last 78 months 1   Other Topics Concern  . Not on file   Social History Narrative   Marital status: married x 18 years.  Children: 2 children (18 yo daughter, 64 yo son)      Lives: with wife, 2 children, 2 cats.      Employment: Marketing executive.  Teaching x 12 years.      Tobacco:  None       Alcohol:  2 drinks per day at most.        Exercise stretches 3 x per week for 30-45 minutes       Seatbelt: 100%      Guns:  Guns lots of them; concealed carrier permit; loaded but secured.     Family History  Problem Relation Age of Onset  . Adopted: Yes       Objective:    BP 132/90 mmHg  Pulse 77  Temp(Src) 98.2 F (36.8 C) (Oral)  Resp 16  Ht 6' (1.829 m)  Wt 310 lb (140.615 kg)  BMI 42.03 kg/m2  SpO2 97% Physical Exam  Constitutional: He is oriented to person, place, and time. He appears well-developed and well-nourished. No distress.  OBESE  HENT:  Head: Normocephalic and atraumatic.  Right Ear: External ear normal.  Left Ear: External ear normal.  Nose: Nose normal.  Mouth/Throat: Oropharynx is clear and moist.  Eyes: Conjunctivae and EOM are normal. Pupils are equal, round, and reactive to light.  Neck: Normal range of motion. Neck supple. Carotid bruit is not present. No thyromegaly present.  Cardiovascular: Normal rate, regular rhythm, normal heart sounds and intact distal pulses.  Exam reveals no gallop and no friction rub.   No murmur heard. Pulmonary/Chest: Effort normal and breath sounds normal. He has no wheezes. He has no rales.  Abdominal: Soft. Bowel sounds are normal. He exhibits no distension and no mass. There is no tenderness. There is no rebound and no guarding. Hernia confirmed negative in the right inguinal area and confirmed negative in the left inguinal area.  Genitourinary: Testes normal and penis normal. Right testis shows no mass, no swelling and no tenderness. Left testis shows no mass, no swelling and no tenderness. Circumcised.  Musculoskeletal:       Right shoulder: Normal.       Left shoulder: Normal.       Cervical back: Normal.  Lymphadenopathy:    He has no cervical adenopathy.       Right: No inguinal adenopathy present.       Left: No inguinal adenopathy present.  Neurological: He is alert and oriented to person, place, and time. He has normal reflexes. No cranial nerve deficit. He exhibits normal muscle tone. Coordination  normal.  Skin: Skin is warm and dry. No rash noted. He is not diaphoretic.  Psychiatric: He has a normal mood and affect. His behavior is normal. Judgment and thought content normal.   Results for orders placed or performed in visit on 04/25/15  CBC with Differential/Platelet  Result Value Ref Range   WBC 9.7 4.0 - 10.5 K/uL   RBC 4.81 4.22 - 5.81 MIL/uL   Hemoglobin 15.4 13.0 - 17.0 g/dL   HCT 44.5 39.0 - 52.0 %   MCV 92.5 78.0 - 100.0 fL   MCH 32.0 26.0 - 34.0 pg   MCHC 34.6 30.0 - 36.0 g/dL   RDW 13.5 11.5 - 15.5 %   Platelets 272 150 - 400 K/uL   MPV 9.9 8.6 - 12.4 fL   Neutrophils Relative % 62 43 - 77 %   Neutro Abs 6.0 1.7 - 7.7 K/uL   Lymphocytes Relative 23 12 -  46 %   Lymphs Abs 2.2 0.7 - 4.0 K/uL   Monocytes Relative 10 3 - 12 %   Monocytes Absolute 1.0 0.1 - 1.0 K/uL   Eosinophils Relative 4 0 - 5 %   Eosinophils Absolute 0.4 0.0 - 0.7 K/uL   Basophils Relative 1 0 - 1 %   Basophils Absolute 0.1 0.0 - 0.1 K/uL   Smear Review Criteria for review not met   Comprehensive metabolic panel  Result Value Ref Range   Sodium 143 135 - 146 mmol/L   Potassium 4.5 3.5 - 5.3 mmol/L   Chloride 103 98 - 110 mmol/L   CO2 27 20 - 31 mmol/L   Glucose, Bld 83 65 - 99 mg/dL   BUN 14 7 - 25 mg/dL   Creat 0.86 0.60 - 1.35 mg/dL   Total Bilirubin 0.8 0.2 - 1.2 mg/dL   Alkaline Phosphatase 68 40 - 115 U/L   AST 23 10 - 40 U/L   ALT 42 9 - 46 U/L   Total Protein 6.7 6.1 - 8.1 g/dL   Albumin 4.1 3.6 - 5.1 g/dL   Calcium 9.0 8.6 - 10.3 mg/dL  Hemoglobin A1c  Result Value Ref Range   Hgb A1c MFr Bld 5.7 (H) <5.7 %   Mean Plasma Glucose 117 (H) <117 mg/dL  Lipid panel  Result Value Ref Range   Cholesterol 130 125 - 200 mg/dL   Triglycerides 107 <150 mg/dL   HDL 40 >=40 mg/dL   Total CHOL/HDL Ratio 3.3 <=5.0 Ratio   VLDL 21 <30 mg/dL   LDL Cholesterol 69 <130 mg/dL  TSH  Result Value Ref Range   TSH 2.253 0.350 - 4.500 uIU/mL  PSA  Result Value Ref Range   PSA 0.53 <=4.00  ng/mL  HIV antibody  Result Value Ref Range   HIV 1&2 Ab, 4th Generation NONREACTIVE NONREACTIVE  RPR  Result Value Ref Range   RPR Ser Ql NON REAC NON REAC  POCT urinalysis dipstick  Result Value Ref Range   Color, UA Yellow    Clarity, UA Clear    Glucose, UA Negative    Bilirubin, UA Negative    Ketones, UA Negative    Spec Grav, UA 1.010    Blood, UA Negative    pH, UA 6.0    Protein, UA Negative    Urobilinogen, UA 1.0    Nitrite, UA Negative    Leukocytes, UA Negative Negative       Assessment & Plan:   1. Routine physical examination   2. Pure hypercholesterolemia   3. Obesity   4. Chronic lower back pain   5. Screening for diabetes mellitus   6. Screening for prostate cancer   7. Screening for HIV (human immunodeficiency virus)   8. Lower respiratory infection   9. Degenerative disc disease, lumbar   10. Screening for STD (sexually transmitted disease)     1. Complete Physical Examination: anticipatory guidance --- exercise, weight loss.  Immunizations UTD.  2.  Hypercholesterolemia: controlled; labs obtained; refills provided. 3.  Obesity: highly recommend weight loss, exercise, low-caloric food choices. 4.  Chronic lower back pain: stable; refill of medications provided. 5.  Screening DMII: borderline glucose intolerance; exercise, weight loss, low-sugar and low-carbohydrate food choices. 5.  Screening HIV: labs obtained. 6.  Lower respiratory infection: New.  Rx for Zithromax, Tessalon Perles, Mucinex DM provided.  7. DDD lumbar spine: stable; continue current treatment plan. 8.  Screening STDs: obtain RPR, HIV. 9. Screening prostate  cancer: obtain PSA.   Orders Placed This Encounter  Procedures  . CBC with Differential/Platelet  . Comprehensive metabolic panel    Order Specific Question:  Has the patient fasted?    Answer:  Yes  . Hemoglobin A1c  . Lipid panel    Order Specific Question:  Has the patient fasted?    Answer:  Yes  . TSH  . PSA    . HIV antibody  . RPR  . POCT urinalysis dipstick   Meds ordered this encounter  Medications  . azithromycin (ZITHROMAX) 250 MG tablet    Sig: Two tablets daily x 1 day, then one tablet daily x 4 days    Dispense:  6 tablet    Refill:  0  . benzonatate (TESSALON) 100 MG capsule    Sig: Take 1-2 capsules (100-200 mg total) by mouth 3 (three) times daily as needed for cough.    Dispense:  40 capsule    Refill:  0  . Dextromethorphan-Guaifenesin (MUCINEX DM) 30-600 MG TB12    Sig: Take 1-2 tablets by mouth 2 (two) times daily as needed.    Dispense:  28 each    Refill:  0  . atorvastatin (LIPITOR) 10 MG tablet    Sig: Take 1 tablet (10 mg total) by mouth daily.    Dispense:  90 tablet    Refill:  3  . cyclobenzaprine (FLEXERIL) 5 MG tablet    Sig: Take 1 tablet (5 mg total) by mouth 3 (three) times daily as needed for muscle spasms.    Dispense:  270 tablet    Refill:  1  . naproxen (NAPROSYN) 500 MG tablet    Sig: Take 1 tablet (500 mg total) by mouth 2 (two) times daily with a meal.    Dispense:  180 tablet    Refill:  1  . traMADol (ULTRAM) 50 MG tablet    Sig: Take 1 tablet (50 mg total) by mouth every 12 (twelve) hours as needed.    Dispense:  60 tablet    Refill:  5  . HYDROcodone-acetaminophen (NORCO/VICODIN) 5-325 MG per tablet    Sig: Take 1 tablet by mouth 2 (two) times daily as needed for moderate pain or severe pain.    Dispense:  45 tablet    Refill:  0    Return in about 6 months (around 10/23/2015) for recheck high cholesterol, chronic lower back pain.   Kristi Elayne Guerin, M.D. [facility] [address] [place], [place]  [address] [phone] phone [phone] fax

## 2015-04-26 LAB — HEMOGLOBIN A1C
Hgb A1c MFr Bld: 5.7 % — ABNORMAL HIGH (ref <5.7)
Mean Plasma Glucose: 117 mg/dL — ABNORMAL HIGH (ref <117)

## 2015-04-26 LAB — HIV ANTIBODY (ROUTINE TESTING W REFLEX): HIV: NONREACTIVE

## 2015-04-26 LAB — RPR

## 2015-04-26 LAB — PSA: PSA: 0.53 ng/mL (ref <=4.00)

## 2015-05-30 DIAGNOSIS — M5136 Other intervertebral disc degeneration, lumbar region: Secondary | ICD-10-CM | POA: Insufficient documentation

## 2015-06-28 ENCOUNTER — Telehealth: Payer: Self-pay

## 2015-06-28 NOTE — Telephone Encounter (Signed)
Pt states he was seen before for a cough and given cough medicine and an antibiotic, have the cough again and would like to have an antibiotic called in. Please call [phone]     [facility] [address]

## 2015-06-29 ENCOUNTER — Ambulatory Visit (INDEPENDENT_AMBULATORY_CARE_PROVIDER_SITE_OTHER): Payer: BC Managed Care – PPO | Admitting: Physician Assistant

## 2015-06-29 VITALS — BP 130/88 | HR 88 | Temp 98.2°F | Resp 18 | Ht 72.0 in | Wt 310.0 lb

## 2015-06-29 DIAGNOSIS — R05 Cough: Secondary | ICD-10-CM

## 2015-06-29 DIAGNOSIS — J45901 Unspecified asthma with (acute) exacerbation: Secondary | ICD-10-CM | POA: Diagnosis not present

## 2015-06-29 DIAGNOSIS — R059 Cough, unspecified: Secondary | ICD-10-CM

## 2015-06-29 MED ORDER — GUAIFENESIN ER 1200 MG PO TB12: 1.0000 | ORAL_TABLET | Freq: Two times a day (BID) | ORAL | Status: DC | PRN

## 2015-06-29 MED ORDER — BENZONATATE 100 MG PO CAPS: 100.0000 mg | ORAL_CAPSULE | Freq: Three times a day (TID) | ORAL | Status: DC | PRN

## 2015-06-29 MED ORDER — HYDROCOD POLST-CPM POLST ER 10-8 MG/5ML PO SUER: 5.0000 mL | Freq: Every evening | ORAL | Status: DC | PRN

## 2015-06-29 MED ORDER — CLARITHROMYCIN 500 MG PO TABS: 500.0000 mg | ORAL_TABLET | Freq: Two times a day (BID) | ORAL | Status: AC

## 2015-06-29 NOTE — Telephone Encounter (Signed)
Advised to RTC for an ABX.

## 2015-06-29 NOTE — Progress Notes (Signed)
[facility] [address], [place] [address] [phone]  Date:  06/29/2015   Name:  Nathan Holder   DOB:  Jan 07, 1971   MRN:  [id number]  PCP:  Reginia Forts, MD    History of Present Illness:  BRYAN GOIN is a 44 y.o. male patient who presents to [facility] for cc of persistent cough and trouble breathing for 1 week.  It is a mildly productive cough milk.  No nasal congestion or fever.  There is some He has used the tessalon pearls, and mucinex which has helped with the cough some, but it continues to be an issue.  He is having some difficulty with breathing, and uses his albuterol twice per day.  He has no allergies.  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  Non-smoker at this time.   Patient Active Problem List   Diagnosis Date Noted  . Degenerative disc disease, lumbar 05/30/2015  . Obesity 04/05/2014  . Pure hypercholesterolemia 04/05/2014  . Chronic lower back pain 04/05/2014    Past Medical History  Diagnosis Date  . Back pain   . Allergy     mold pollen  . Asthma     very mild  . Hyperlipidemia     Past Surgical History  Procedure Laterality Date  . Appendectomy    . Back surgery    . Spine surgery  08/06/2008    diskectomy L4-5 2010, 2004.    Social History  Substance Use Topics  . Smoking status: Former Research scientist (life sciences)  . Smokeless tobacco: Never Used  . Alcohol Use: 0.5 - 1.0 oz/week    1-2 Standard drinks or equivalent per week     Comment: drink daily-2 drinks, per Health Survey (05/03/14)  10-16 drinks    Family History  Problem Relation Age of Onset  . Adopted: Yes    No Known Allergies  Medication list has been reviewed and updated.  Current Outpatient Prescriptions on File Prior to Visit  Medication Sig Dispense Refill  . albuterol (VENTOLIN HFA) 108 (90 BASE) MCG/ACT inhaler Inhale 2 puffs into the lungs every 4 (four) hours as needed for wheezing. 1 Inhaler 2  . atorvastatin (LIPITOR) 10 MG tablet Take 1 tablet (10 mg total)  by mouth daily. 90 tablet 3  . azithromycin (ZITHROMAX) 250 MG tablet Two tablets daily x 1 day, then one tablet daily x 4 days 6 tablet 0  . benzonatate (TESSALON) 100 MG capsule Take 1-2 capsules (100-200 mg total) by mouth 3 (three) times daily as needed for cough. 40 capsule 0  . cyclobenzaprine (FLEXERIL) 5 MG tablet Take 1 tablet (5 mg total) by mouth 3 (three) times daily as needed for muscle spasms. 270 tablet 1  . Dextromethorphan-Guaifenesin (MUCINEX DM) 30-600 MG TB12 Take 1-2 tablets by mouth 2 (two) times daily as needed. 28 each 0  . fish oil-omega-3 fatty acids 1000 MG capsule Take 2 g by mouth daily.    . Flaxseed, Linseed, (FLAX SEEDS PO) Take by mouth.    Marland Kitchen HYDROcodone-acetaminophen (NORCO/VICODIN) 5-325 MG per tablet Take 1 tablet by mouth 2 (two) times daily as needed for moderate pain or severe pain. 45 tablet 0  . naproxen (NAPROSYN) 500 MG tablet Take 1 tablet (500 mg total) by mouth 2 (two) times daily with a meal. 180 tablet 1  . nystatin-triamcinolone ointment (MYCOLOG) Apply 1 application topically 2 (two) times daily. 30 g 3  . OVER THE COUNTER MEDICATION Vitamin B6 taken twice a day    .  traMADol (ULTRAM) 50 MG tablet Take 1 tablet (50 mg total) by mouth every 12 (twelve) hours as needed. 60 tablet 5   No current facility-administered medications on file prior to visit.    ROS ROS otherwise unremarkable unless listed above.   Physical Examination: BP 130/88 mmHg  Pulse 88  Temp(Src) 98.2 F (36.8 C) (Oral)  Resp 18  Ht 6' (1.829 m)  Wt 310 lb (140.615 kg)  BMI 42.03 kg/m2  SpO2 98% Ideal Body Weight: Weight in (lb) to have BMI = 25: 183.9  Physical Exam  Constitutional: He is oriented to person, place, and time. He appears well-developed and well-nourished. No distress.  HENT:  Head: Atraumatic.  Right Ear: Tympanic membrane, external ear and ear canal normal.  Left Ear: Tympanic membrane, external ear and ear canal normal.  Nose: No mucosal edema or  rhinorrhea. Right sinus exhibits no maxillary sinus tenderness and no frontal sinus tenderness. Left sinus exhibits no maxillary sinus tenderness and no frontal sinus tenderness.  Mouth/Throat: No uvula swelling. Posterior oropharyngeal edema (mildly edematous) present. No oropharyngeal exudate or posterior oropharyngeal erythema (mild).  Eyes: Conjunctivae, EOM and lids are normal. Pupils are equal, round, and reactive to light. Right eye exhibits normal extraocular motion. Left eye exhibits normal extraocular motion.  Neck: Trachea normal and full passive range of motion without pain. No edema and no erythema present.  Cardiovascular: Normal rate and regular rhythm.  Exam reveals no friction rub.   No murmur heard. Pulmonary/Chest: Effort normal. No respiratory distress. He has no decreased breath sounds. He has no wheezes. He has no rhonchi.  Neurological: He is alert and oriented to person, place, and time.  Skin: Skin is warm and dry. He is not diaphoretic.  Psychiatric: He has a normal mood and affect. His behavior is normal.     Assessment and Plan: VOLNEY REIERSON is a 44 y.o. male who is here today for chief complaint of cough and trouble breathing for 7 days. This patient and plan was discussed with Dr. Ouida Sills Bronchitis complicating asthma at this time.  Given biaxin today.  Treating supportively as shown below.  Advised heavy hydration, and to increase albuterol use.  Alarming sxs advised.   Asthma with exacerbation, unspecified asthma severity - Plan: clarithromycin (BIAXIN) 500 MG tablet, benzonatate (TESSALON) 100 MG capsule, chlorpheniramine-HYDROcodone (TUSSIONEX PENNKINETIC ER) 10-8 MG/5ML SUER, Guaifenesin (MUCINEX MAXIMUM STRENGTH) 1200 MG TB12  Cough - Plan: benzonatate (TESSALON) 100 MG capsule, chlorpheniramine-HYDROcodone (TUSSIONEX PENNKINETIC ER) 10-8 MG/5ML SUER, Guaifenesin (MUCINEX MAXIMUM STRENGTH) 1200 MG TB12  Ivar Drape, PA-C [facility] 06/29/2015 12:33 PM

## 2015-06-29 NOTE — Patient Instructions (Addendum)
Increase your inhaler use.  If you continue to have issues with your breathing, please let us know.   Acute Bronchitis Bronchitis is when the airways that extend from the windpipe into the lungs get red, puffy, and painful (inflamed). Bronchitis often causes thick spit (mucus) to develop. This leads to a cough. A cough is the most common symptom of bronchitis. In acute bronchitis, the condition usually begins suddenly and goes away over time (usually in 2 weeks). Smoking, allergies, and asthma can make bronchitis worse. Repeated episodes of bronchitis may cause more lung problems. HOME CARE  Rest.  Drink enough fluids to keep your pee (urine) clear or pale yellow (unless you need to limit fluids as told by your doctor).  Only take over-the-counter or prescription medicines as told by your doctor.  Avoid smoking and secondhand smoke. These can make bronchitis worse. If you are a smoker, think about using nicotine gum or skin patches. Quitting smoking will help your lungs heal faster.  Reduce the chance of getting bronchitis again by:  Washing your hands often.  Avoiding people with cold symptoms.  Trying not to touch your hands to your mouth, nose, or eyes.  Follow up with your doctor as told. GET HELP IF: Your symptoms do not improve after 1 week of treatment. Symptoms include:  Cough.  Fever.  Coughing up thick spit.  Body aches.  Chest congestion.  Chills.  Shortness of breath.  Sore throat. GET HELP RIGHT AWAY IF:   You have an increased fever.  You have chills.  You have severe shortness of breath.  You have bloody thick spit (sputum).  You throw up (vomit) often.  You lose too much body fluid (dehydration).  You have a severe headache.  You faint. MAKE SURE YOU:   Understand these instructions.  Will watch your condition.  Will get help right away if you are not doing well or get worse.   This information is not intended to replace advice given  to you by your health care provider. Make sure you discuss any questions you have with your health care provider.   Document Released: 01/09/2008 Document Revised: 03/25/2013 Document Reviewed: 01/13/2013 Elsevier Interactive Patient Education [employer].

## 2015-09-30 ENCOUNTER — Ambulatory Visit: Payer: BC Managed Care – PPO | Admitting: Family Medicine

## 2015-10-04 ENCOUNTER — Ambulatory Visit (INDEPENDENT_AMBULATORY_CARE_PROVIDER_SITE_OTHER): Payer: BC Managed Care – PPO | Admitting: Family Medicine

## 2015-10-04 ENCOUNTER — Encounter: Payer: Self-pay | Admitting: Family Medicine

## 2015-10-04 VITALS — BP 118/76 | HR 93 | Temp 99.0°F | Resp 16 | Ht 73.0 in | Wt 318.2 lb

## 2015-10-04 DIAGNOSIS — E669 Obesity, unspecified: Secondary | ICD-10-CM

## 2015-10-04 DIAGNOSIS — G8929 Other chronic pain: Secondary | ICD-10-CM

## 2015-10-04 DIAGNOSIS — M5136 Other intervertebral disc degeneration, lumbar region: Secondary | ICD-10-CM

## 2015-10-04 DIAGNOSIS — E7800 Pure hypercholesterolemia, unspecified: Secondary | ICD-10-CM

## 2015-10-04 DIAGNOSIS — M545 Low back pain: Secondary | ICD-10-CM | POA: Diagnosis not present

## 2015-10-04 DIAGNOSIS — J4520 Mild intermittent asthma, uncomplicated: Secondary | ICD-10-CM | POA: Diagnosis not present

## 2015-10-04 MED ORDER — CYCLOBENZAPRINE HCL 5 MG PO TABS: 5.0000 mg | ORAL_TABLET | Freq: Three times a day (TID) | ORAL | Status: DC | PRN

## 2015-10-04 MED ORDER — TRAMADOL HCL 50 MG PO TABS: 50.0000 mg | ORAL_TABLET | Freq: Two times a day (BID) | ORAL | Status: DC | PRN

## 2015-10-04 MED ORDER — ALBUTEROL SULFATE HFA 108 (90 BASE) MCG/ACT IN AERS: 2.0000 | INHALATION_SPRAY | Freq: Four times a day (QID) | RESPIRATORY_TRACT | Status: DC | PRN

## 2015-10-04 MED ORDER — HYDROCODONE-ACETAMINOPHEN 5-325 MG PO TABS: 1.0000 | ORAL_TABLET | Freq: Two times a day (BID) | ORAL | Status: DC | PRN

## 2015-10-04 MED ORDER — NAPROXEN 500 MG PO TABS: 500.0000 mg | ORAL_TABLET | Freq: Two times a day (BID) | ORAL | Status: DC

## 2015-10-04 MED ORDER — ATORVASTATIN CALCIUM 10 MG PO TABS: 10.0000 mg | ORAL_TABLET | Freq: Every day | ORAL | Status: DC

## 2015-10-04 NOTE — Progress Notes (Signed)
Subjective:    Patient ID: Nathan Holder, male    DOB: 03-09-1971, 45 y.o.   MRN: [id number]  10/04/2015  Medication Refill   HPI This 45 y.o. male presents for six month follow-up:   1. Hyperlipidemia: Patient reports good compliance with medication, good tolerance to medication, and good symptom control.    2. DDD lumbar spine:  Patient reports good compliance with medication, good tolerance to medication, and good symptom control.   Naproxen once daily .am Flexeril twice daily. Am, midday Tramadol once daily. midday Hydrocodone PRN. DDD lumbar is really good.  Doing well. No radicular symptoms.  No n/t/w.  No saddle paresthesias. No b/b function.  3.  Asthma: three acute illnesses.  In [place], had horrible allergies. Moved to [place] and all allergies went away. Recently, with last illness; GERD developed.  Has been burning some.  GERD symptoms have resolved.  Really bad with acute illness.  Occurs twice per year.  Prolonged for a few days.  No Claritin or Flonase.  Forgets to take allergy medication.     Review of Systems  Constitutional: Negative for fever, chills, diaphoresis, activity change, appetite change and fatigue.  Respiratory: Negative for cough and shortness of breath.   Cardiovascular: Negative for chest pain, palpitations and leg swelling.  Gastrointestinal: Negative for nausea, vomiting, abdominal pain and diarrhea.  Endocrine: Negative for cold intolerance, heat intolerance, polydipsia, polyphagia and polyuria.  Skin: Negative for color change, rash and wound.  Neurological: Negative for dizziness, tremors, seizures, syncope, facial asymmetry, speech difficulty, weakness, light-headedness, numbness and headaches.  Psychiatric/Behavioral: Negative for sleep disturbance and dysphoric mood. The patient is not nervous/anxious.     Past Medical History  Diagnosis Date  . Back pain   . Allergy     mold pollen  . Asthma     very mild  . Hyperlipidemia     Past Surgical History  Procedure Laterality Date  . Appendectomy    . Back surgery    . Spine surgery  08/06/2008    diskectomy L4-5 2010, 2004.   No Known Allergies Current Outpatient Prescriptions  Medication Sig Dispense Refill  . albuterol (VENTOLIN HFA) 108 (90 Base) MCG/ACT inhaler Inhale 2 puffs into the lungs every 6 (six) hours as needed for wheezing. 1 Inhaler 2  . atorvastatin (LIPITOR) 10 MG tablet Take 1 tablet (10 mg total) by mouth daily. 90 tablet 3  . cyclobenzaprine (FLEXERIL) 5 MG tablet Take 1 tablet (5 mg total) by mouth 3 (three) times daily as needed for muscle spasms. 270 tablet 1  . fish oil-omega-3 fatty acids 1000 MG capsule Take 2 g by mouth daily.    . Flaxseed, Linseed, (FLAX SEEDS PO) Take by mouth.    Marland Kitchen HYDROcodone-acetaminophen (NORCO/VICODIN) 5-325 MG tablet Take 1 tablet by mouth 2 (two) times daily as needed for moderate pain or severe pain. 45 tablet 0  . naproxen (NAPROSYN) 500 MG tablet Take 1 tablet (500 mg total) by mouth 2 (two) times daily with a meal. 180 tablet 1  . OVER THE COUNTER MEDICATION Vitamin B6 taken twice a day    . traMADol (ULTRAM) 50 MG tablet Take 1 tablet (50 mg total) by mouth every 12 (twelve) hours as needed. 60 tablet 5  . nystatin-triamcinolone ointment (MYCOLOG) Apply 1 application topically 2 (two) times daily. (Patient not taking: Reported on 10/04/2015) 30 g 3   No current facility-administered medications for this visit.   Social History   Social History  .  Marital Status: Married    Spouse Name: N/A  . Number of Children: 2  . Years of Education: N/A   Occupational History  . teacher     Pre-K teacher   Social History Main Topics  . Smoking status: Former Research scientist (life sciences)  . Smokeless tobacco: Never Used  . Alcohol Use: 0.5 - 1.0 oz/week    1-2 Standard drinks or equivalent per week     Comment: drink daily-2 drinks, per Health Survey (05/03/14)  10-16 drinks  . Drug Use: No  . Sexual Activity: Yes    Birth  Control/ Protection: None     Comment: number of sex partners in the last 48 months 1   Other Topics Concern  . Not on file   Social History Narrative   Marital status: married x 18 years.      Children: 2 children (52 yo daughter, 56 yo son)      Lives: with wife, 2 children, 2 cats.      Employment: Marketing executive.  Teaching x 12 years.      Tobacco:  None       Alcohol:  2 drinks per day at most.        Exercise stretches 3 x per week for 30-45 minutes      Seatbelt: 100%      Guns:  Guns lots of them; concealed carrier permit; loaded but secured.     Family History  Problem Relation Age of Onset  . Adopted: Yes       Objective:    BP 118/76 mmHg  Pulse 93  Temp(Src) 99 F (37.2 C) (Oral)  Resp 16  Ht 6' 1"  (1.854 m)  Wt 318 lb 3.2 oz (144.335 kg)  BMI 41.99 kg/m2  SpO2 96% Physical Exam  Constitutional: He is oriented to person, place, and time. He appears well-developed and well-nourished. No distress.  HENT:  Head: Normocephalic and atraumatic.  Right Ear: External ear normal.  Left Ear: External ear normal.  Nose: Nose normal.  Mouth/Throat: Oropharynx is clear and moist.  Eyes: Conjunctivae and EOM are normal. Pupils are equal, round, and reactive to light.  Neck: Normal range of motion. Neck supple. Carotid bruit is not present. No thyromegaly present.  Cardiovascular: Normal rate, regular rhythm, normal heart sounds and intact distal pulses.  Exam reveals no gallop and no friction rub.   No murmur heard. Pulmonary/Chest: Effort normal and breath sounds normal. He has no wheezes. He has no rales.  Abdominal: Soft. Bowel sounds are normal. He exhibits no distension and no mass. There is no tenderness. There is no rebound and no guarding.  Lymphadenopathy:    He has no cervical adenopathy.  Neurological: He is alert and oriented to person, place, and time. No cranial nerve deficit.  Skin: Skin is warm and dry. No rash noted. He is not  diaphoretic.  Psychiatric: He has a normal mood and affect. His behavior is normal.  Nursing note and vitals reviewed.       Assessment & Plan:   1. Pure hypercholesterolemia   2. Chronic lower back pain   3. Obesity   4. Degenerative disc disease, lumbar   5. Asthma, chronic, mild intermittent, uncomplicated     Orders Placed This Encounter  Procedures  . CBC with Differential/Platelet  . Comprehensive metabolic panel    Order Specific Question:  Has the patient fasted?    Answer:  Yes  . Lipid panel    Order Specific  Question:  Has the patient fasted?    Answer:  Yes   Meds ordered this encounter  Medications  . albuterol (VENTOLIN HFA) 108 (90 Base) MCG/ACT inhaler    Sig: Inhale 2 puffs into the lungs every 6 (six) hours as needed for wheezing.    Dispense:  1 Inhaler    Refill:  2  . traMADol (ULTRAM) 50 MG tablet    Sig: Take 1 tablet (50 mg total) by mouth every 12 (twelve) hours as needed.    Dispense:  60 tablet    Refill:  5  . naproxen (NAPROSYN) 500 MG tablet    Sig: Take 1 tablet (500 mg total) by mouth 2 (two) times daily with a meal.    Dispense:  180 tablet    Refill:  1  . HYDROcodone-acetaminophen (NORCO/VICODIN) 5-325 MG tablet    Sig: Take 1 tablet by mouth 2 (two) times daily as needed for moderate pain or severe pain.    Dispense:  45 tablet    Refill:  0  . cyclobenzaprine (FLEXERIL) 5 MG tablet    Sig: Take 1 tablet (5 mg total) by mouth 3 (three) times daily as needed for muscle spasms.    Dispense:  270 tablet    Refill:  1  . atorvastatin (LIPITOR) 10 MG tablet    Sig: Take 1 tablet (10 mg total) by mouth daily.    Dispense:  90 tablet    Refill:  3    Return for complete physical examiniation.    Kolbey Teichert Elayne Guerin, M.D. [facility] [address] [place], [place]  [address] [phone] phone [phone] fax

## 2015-10-04 NOTE — Patient Instructions (Signed)
Why follow it? Research shows. . Those who follow the Mediterranean diet have a reduced risk of heart disease  . The diet is associated with a reduced incidence of Parkinson's and Alzheimer's diseases . People following the diet may have longer life expectancies and lower rates of chronic diseases  . The Dietary Guidelines for Americans recommends the Mediterranean diet as an eating plan to promote health and prevent disease  What Is the Mediterranean Diet?  . Healthy eating plan based on typical foods and recipes of Mediterranean-style cooking . The diet is primarily a plant based diet; these foods should make up a majority of meals   Starches - Plant based foods should make up a majority of meals - They are an important sources of vitamins, minerals, energy, antioxidants, and fiber - Choose whole grains, foods high in fiber and minimally processed items  - Typical grain sources include wheat, oats, barley, corn, brown rice, bulgar, farro, millet, polenta, couscous  - Various types of beans include chickpeas, lentils, fava beans, black beans, white beans   Fruits  Veggies - Large quantities of antioxidant rich fruits & veggies; 6 or more servings  - Vegetables can be eaten raw or lightly drizzled with oil and cooked  - Vegetables common to the traditional Mediterranean Diet include: artichokes, arugula, beets, broccoli, brussel sprouts, cabbage, carrots, celery, collard greens, cucumbers, eggplant, kale, leeks, lemons, lettuce, mushrooms, okra, onions, peas, peppers, potatoes, pumpkin, radishes, rutabaga, shallots, spinach, sweet potatoes, turnips, zucchini - Fruits common to the Mediterranean Diet include: apples, apricots, avocados, cherries, clementines, dates, figs, grapefruits, grapes, melons, nectarines, oranges, peaches, pears, pomegranates, strawberries, tangerines  Fats - Replace butter and margarine with healthy oils, such as olive oil, canola oil, and tahini  - Limit nuts to no  more than a handful a day  - Nuts include walnuts, almonds, pecans, pistachios, pine nuts  - Limit or avoid candied, honey roasted or heavily salted nuts - Olives are central to the [employer] - can be eaten whole or used in a variety of dishes   Meats Protein - Limiting red meat: no more than a few times a month - When eating red meat: choose lean cuts and keep the portion to the size of deck of cards - Eggs: approx. 0 to 4 times a week  - Fish and lean poultry: at least 2 a week  - Healthy protein sources include, chicken, [place], lean beef, lamb - Increase intake of seafood such as tuna, salmon, trout, mackerel, shrimp, scallops - Avoid or limit high fat processed meats such as sausage and bacon  Dairy - Include moderate amounts of low fat dairy products  - Focus on healthy dairy such as fat free yogurt, skim milk, low or reduced fat cheese - Limit dairy products higher in fat such as whole or 2% milk, cheese, ice cream  Alcohol - Moderate amounts of red wine is ok  - No more than 5 oz daily for women (all ages) and men older than age 31  - No more than 10 oz of wine daily for men younger than 69  Other - Limit sweets and other desserts  - Use herbs and spices instead of salt to flavor foods  - Herbs and spices common to the traditional Mediterranean Diet include: basil, bay leaves, chives, cloves, cumin, fennel, garlic, lavender, marjoram, mint, oregano, parsley, pepper, rosemary, sage, savory, sumac, tarragon, thyme   It's not just a diet, it's a lifestyle:  . The Mediterranean diet includes  lifestyle factors typical of those in the region  . Foods, drinks and meals are best eaten with others and savored . Daily physical activity is important for overall good health . This could be strenuous exercise like running and aerobics . This could also be more leisurely activities such as walking, housework, yard-work, or taking the stairs . Moderation is the key; a balanced and  healthy diet accommodates most foods and drinks . Consider portion sizes and frequency of consumption of certain foods   Meal Ideas & Options:  . Breakfast:  o Whole wheat toast or whole wheat English muffins with peanut butter & hard boiled egg o Steel cut oats topped with apples & cinnamon and skim milk  o Fresh fruit: banana, strawberries, melon, berries, peaches  o Smoothies: strawberries, bananas, greek yogurt, peanut butter o Low fat greek yogurt with blueberries and granola  o Egg white omelet with spinach and mushrooms o Breakfast couscous: whole wheat couscous, apricots, skim milk, cranberries  . Sandwiches:  o Hummus and grilled vegetables (peppers, zucchini, squash) on whole wheat bread   o Grilled chicken on whole wheat pita with lettuce, tomatoes, cucumbers or tzatziki  o Tuna salad on whole wheat bread: tuna salad made with greek yogurt, olives, red peppers, capers, green onions o Garlic rosemary lamb pita: lamb sauted with garlic, rosemary, salt & pepper; add lettuce, cucumber, greek yogurt to pita - flavor with lemon juice and black pepper  . Seafood:  o Mediterranean grilled salmon, seasoned with garlic, basil, parsley, lemon juice and black pepper o Shrimp, lemon, and spinach whole-grain pasta salad made with low fat greek yogurt  o Seared scallops with lemon orzo  o Seared tuna steaks seasoned salt, pepper, coriander topped with tomato mixture of olives, tomatoes, olive oil, minced garlic, parsley, green onions and cappers  . Meats:  o Herbed greek chicken salad with kalamata olives, cucumber, feta  o Red bell peppers stuffed with spinach, bulgur, lean ground beef (or lentils) & topped with feta   o Kebabs: skewers of chicken, tomatoes, onions, zucchini, squash  o [place] burgers: made with red onions, mint, dill, lemon juice, feta cheese topped with roasted red peppers . Vegetarian o Cucumber salad: cucumbers, artichoke hearts, celery, red onion, feta cheese, tossed in  olive oil & lemon juice  o Hummus and whole grain pita points with a greek salad (lettuce, tomato, feta, olives, cucumbers, red onion) o Lentil soup with celery, carrots made with vegetable broth, garlic, salt and pepper  o Tabouli salad: parsley, bulgur, mint, scallions, cucumbers, tomato, radishes, lemon juice, olive oil, salt and pepper.

## 2015-10-05 LAB — CBC WITH DIFFERENTIAL/PLATELET
BASOS ABS: 0.1 10*3/uL (ref 0.0–0.1)
Basophils Relative: 1 % (ref 0–1)
EOS ABS: 0.2 10*3/uL (ref 0.0–0.7)
Eosinophils Relative: 2 % (ref 0–5)
HCT: 46.5 % (ref 39.0–52.0)
HEMOGLOBIN: 16.2 g/dL (ref 13.0–17.0)
LYMPHS ABS: 2.7 10*3/uL (ref 0.7–4.0)
LYMPHS PCT: 33 % (ref 12–46)
MCH: 32.1 pg (ref 26.0–34.0)
MCHC: 34.8 g/dL (ref 30.0–36.0)
MCV: 92.1 fL (ref 78.0–100.0)
MPV: 10.2 fL (ref 8.6–12.4)
Monocytes Absolute: 0.7 10*3/uL (ref 0.1–1.0)
Monocytes Relative: 9 % (ref 3–12)
NEUTROS ABS: 4.6 10*3/uL (ref 1.7–7.7)
NEUTROS PCT: 55 % (ref 43–77)
PLATELETS: 286 10*3/uL (ref 150–400)
RBC: 5.05 MIL/uL (ref 4.22–5.81)
RDW: 13.2 % (ref 11.5–15.5)
WBC: 8.3 10*3/uL (ref 4.0–10.5)

## 2015-10-05 LAB — COMPREHENSIVE METABOLIC PANEL
ALK PHOS: 64 U/L (ref 40–115)
ALT: 105 U/L — AB (ref 9–46)
AST: 47 U/L — ABNORMAL HIGH (ref 10–40)
Albumin: 4.6 g/dL (ref 3.6–5.1)
BUN: 15 mg/dL (ref 7–25)
CO2: 26 mmol/L (ref 20–31)
CREATININE: 1.01 mg/dL (ref 0.60–1.35)
Calcium: 9.6 mg/dL (ref 8.6–10.3)
Chloride: 104 mmol/L (ref 98–110)
Glucose, Bld: 95 mg/dL (ref 65–99)
POTASSIUM: 4.4 mmol/L (ref 3.5–5.3)
Sodium: 140 mmol/L (ref 135–146)
TOTAL PROTEIN: 7.7 g/dL (ref 6.1–8.1)
Total Bilirubin: 0.7 mg/dL (ref 0.2–1.2)

## 2015-10-05 LAB — LIPID PANEL
CHOL/HDL RATIO: 3.8 ratio (ref <=5.0)
CHOLESTEROL: 174 mg/dL (ref 125–200)
HDL: 46 mg/dL (ref >=40)
LDL Cholesterol: 100 mg/dL (ref <130)
Triglycerides: 141 mg/dL (ref <150)
VLDL: 28 mg/dL (ref <30)

## 2015-10-20 ENCOUNTER — Other Ambulatory Visit: Payer: Self-pay | Admitting: Family Medicine

## 2015-10-20 DIAGNOSIS — R7989 Other specified abnormal findings of blood chemistry: Secondary | ICD-10-CM

## 2015-10-20 DIAGNOSIS — R945 Abnormal results of liver function studies: Principal | ICD-10-CM

## 2015-11-04 ENCOUNTER — Telehealth: Payer: Self-pay | Admitting: Family Medicine

## 2015-11-04 ENCOUNTER — Other Ambulatory Visit: Payer: Self-pay | Admitting: Family Medicine

## 2015-11-04 NOTE — Telephone Encounter (Signed)
SPoke with lab today.

## 2015-11-04 NOTE — Telephone Encounter (Signed)
Pt would like follow up call for lab results- abnormal liver fxn/// Pt also wants to advise that he was on an [employer] for about 2 wks at the time of previous blood collection.  [phone]

## 2015-11-14 ENCOUNTER — Other Ambulatory Visit: Payer: Self-pay | Admitting: Family Medicine

## 2015-11-14 ENCOUNTER — Other Ambulatory Visit (INDEPENDENT_AMBULATORY_CARE_PROVIDER_SITE_OTHER): Payer: BC Managed Care – PPO | Admitting: Family Medicine

## 2015-11-14 DIAGNOSIS — R945 Abnormal results of liver function studies: Principal | ICD-10-CM

## 2015-11-14 DIAGNOSIS — R7989 Other specified abnormal findings of blood chemistry: Secondary | ICD-10-CM

## 2015-11-14 LAB — COMPREHENSIVE METABOLIC PANEL
ALBUMIN: 4.7 g/dL (ref 3.6–5.1)
ALT: 97 U/L — AB (ref 9–46)
AST: 38 U/L (ref 10–40)
Alkaline Phosphatase: 64 U/L (ref 40–115)
BUN: 14 mg/dL (ref 7–25)
CHLORIDE: 103 mmol/L (ref 98–110)
CO2: 26 mmol/L (ref 20–31)
CREATININE: 0.86 mg/dL (ref 0.60–1.35)
Calcium: 10.2 mg/dL (ref 8.6–10.3)
GLUCOSE: 93 mg/dL (ref 65–99)
POTASSIUM: 4.6 mmol/L (ref 3.5–5.3)
SODIUM: 139 mmol/L (ref 135–146)
TOTAL PROTEIN: 7.7 g/dL (ref 6.1–8.1)
Total Bilirubin: 1 mg/dL (ref 0.2–1.2)

## 2015-11-18 LAB — HEPATITIS PANEL, ACUTE
HCV Ab: NEGATIVE
Hep A IgM: NONREACTIVE
Hep B C IgM: NONREACTIVE
Hepatitis B Surface Ag: NEGATIVE

## 2015-11-27 ENCOUNTER — Other Ambulatory Visit: Payer: Self-pay | Admitting: Family Medicine

## 2015-11-27 DIAGNOSIS — R7989 Other specified abnormal findings of blood chemistry: Secondary | ICD-10-CM

## 2015-11-27 DIAGNOSIS — R945 Abnormal results of liver function studies: Principal | ICD-10-CM

## 2016-01-17 ENCOUNTER — Other Ambulatory Visit: Payer: BC Managed Care – PPO

## 2016-01-17 DIAGNOSIS — R7989 Other specified abnormal findings of blood chemistry: Secondary | ICD-10-CM

## 2016-01-17 DIAGNOSIS — R945 Abnormal results of liver function studies: Principal | ICD-10-CM

## 2016-01-17 LAB — COMPREHENSIVE METABOLIC PANEL
ALK PHOS: 68 U/L (ref 40–115)
ALT: 42 U/L (ref 9–46)
AST: 25 U/L (ref 10–40)
Albumin: 4.4 g/dL (ref 3.6–5.1)
BUN: 13 mg/dL (ref 7–25)
CALCIUM: 9.3 mg/dL (ref 8.6–10.3)
CO2: 24 mmol/L (ref 20–31)
Chloride: 103 mmol/L (ref 98–110)
Creat: 0.94 mg/dL (ref 0.60–1.35)
GLUCOSE: 84 mg/dL (ref 65–99)
POTASSIUM: 4.1 mmol/L (ref 3.5–5.3)
Sodium: 138 mmol/L (ref 135–146)
Total Bilirubin: 1 mg/dL (ref 0.2–1.2)
Total Protein: 7.1 g/dL (ref 6.1–8.1)

## 2016-01-17 LAB — FERRITIN: FERRITIN: 227 ng/mL (ref 20–380)

## 2016-01-17 LAB — CK: Total CK: 179 U/L (ref 7–232)

## 2016-01-17 LAB — IRON: Iron: 154 ug/dL (ref 50–180)

## 2016-01-23 ENCOUNTER — Ambulatory Visit
Admission: RE | Admit: 2016-01-23 | Discharge: 2016-01-23 | Disposition: A | Discharge status: Home / Self Care | Payer: BC Managed Care – PPO | Source: Ambulatory Visit | Attending: Family Medicine | Admitting: Family Medicine

## 2016-01-23 DIAGNOSIS — R7989 Other specified abnormal findings of blood chemistry: Secondary | ICD-10-CM

## 2016-01-23 DIAGNOSIS — R945 Abnormal results of liver function studies: Principal | ICD-10-CM

## 2016-04-03 ENCOUNTER — Encounter: Payer: Self-pay | Admitting: Family Medicine

## 2016-04-03 ENCOUNTER — Ambulatory Visit (INDEPENDENT_AMBULATORY_CARE_PROVIDER_SITE_OTHER): Payer: BC Managed Care – PPO | Admitting: Family Medicine

## 2016-04-03 VITALS — BP 140/92 | HR 73 | Resp 16 | Ht 72.0 in | Wt 304.6 lb

## 2016-04-03 DIAGNOSIS — K7581 Nonalcoholic steatohepatitis (NASH): Secondary | ICD-10-CM

## 2016-04-03 DIAGNOSIS — E669 Obesity, unspecified: Secondary | ICD-10-CM

## 2016-04-03 DIAGNOSIS — M5136 Other intervertebral disc degeneration, lumbar region: Secondary | ICD-10-CM | POA: Diagnosis not present

## 2016-04-03 DIAGNOSIS — M545 Low back pain: Secondary | ICD-10-CM | POA: Diagnosis not present

## 2016-04-03 DIAGNOSIS — Z0000 Encounter for general adult medical examination without abnormal findings: Secondary | ICD-10-CM

## 2016-04-03 DIAGNOSIS — M51369 Other intervertebral disc degeneration, lumbar region without mention of lumbar back pain or lower extremity pain: Secondary | ICD-10-CM

## 2016-04-03 DIAGNOSIS — Z23 Encounter for immunization: Secondary | ICD-10-CM

## 2016-04-03 DIAGNOSIS — N471 Phimosis: Secondary | ICD-10-CM

## 2016-04-03 DIAGNOSIS — G8929 Other chronic pain: Secondary | ICD-10-CM | POA: Diagnosis not present

## 2016-04-03 DIAGNOSIS — Z125 Encounter for screening for malignant neoplasm of prostate: Secondary | ICD-10-CM | POA: Diagnosis not present

## 2016-04-03 DIAGNOSIS — Z131 Encounter for screening for diabetes mellitus: Secondary | ICD-10-CM | POA: Diagnosis not present

## 2016-04-03 DIAGNOSIS — E7800 Pure hypercholesterolemia, unspecified: Secondary | ICD-10-CM

## 2016-04-03 DIAGNOSIS — J4520 Mild intermittent asthma, uncomplicated: Secondary | ICD-10-CM | POA: Diagnosis not present

## 2016-04-03 LAB — POCT URINALYSIS DIP (MANUAL ENTRY)
BILIRUBIN UA: NEGATIVE
GLUCOSE UA: NEGATIVE
Ketones, POC UA: NEGATIVE
Leukocytes, UA: NEGATIVE
NITRITE UA: NEGATIVE
Protein Ur, POC: NEGATIVE
RBC UA: NEGATIVE
Spec Grav, UA: 1.025
Urobilinogen, UA: 0.2
pH, UA: 5

## 2016-04-03 LAB — CBC WITH DIFFERENTIAL/PLATELET
BASOS PCT: 1 %
Basophils Absolute: 84 cells/uL (ref 0–200)
EOS ABS: 168 {cells}/uL (ref 15–500)
Eosinophils Relative: 2 %
HCT: 49 % (ref 38.5–50.0)
Hemoglobin: 16.5 g/dL (ref 13.2–17.1)
LYMPHS PCT: 23 %
Lymphs Abs: 1932 cells/uL (ref 850–3900)
MCH: 31.7 pg (ref 27.0–33.0)
MCHC: 33.7 g/dL (ref 32.0–36.0)
MCV: 94 fL (ref 80.0–100.0)
MONOS PCT: 8 %
MPV: 10.6 fL (ref 7.5–12.5)
Monocytes Absolute: 672 cells/uL (ref 200–950)
Neutro Abs: 5544 cells/uL (ref 1500–7800)
Neutrophils Relative %: 66 %
PLATELETS: 184 10*3/uL (ref 140–400)
RBC: 5.21 MIL/uL (ref 4.20–5.80)
RDW: 13.2 % (ref 11.0–15.0)
WBC: 8.4 10*3/uL (ref 3.8–10.8)

## 2016-04-03 LAB — HEMOGLOBIN A1C
HEMOGLOBIN A1C: 5.4 % (ref <5.7)
MEAN PLASMA GLUCOSE: 108 mg/dL

## 2016-04-03 LAB — POC MICROSCOPIC URINALYSIS (UMFC): MUCUS RE: ABSENT

## 2016-04-03 MED ORDER — NYSTATIN-TRIAMCINOLONE 100000-0.1 UNIT/GM-% EX OINT: 1.0000 "application " | TOPICAL_OINTMENT | Freq: Two times a day (BID) | CUTANEOUS | 3 refills | Status: DC

## 2016-04-03 MED ORDER — ALBUTEROL SULFATE HFA 108 (90 BASE) MCG/ACT IN AERS: 2.0000 | INHALATION_SPRAY | Freq: Four times a day (QID) | RESPIRATORY_TRACT | 2 refills | Status: DC | PRN

## 2016-04-03 MED ORDER — CYCLOBENZAPRINE HCL 5 MG PO TABS: 5.0000 mg | ORAL_TABLET | Freq: Three times a day (TID) | ORAL | 1 refills | Status: DC | PRN

## 2016-04-03 MED ORDER — NAPROXEN 500 MG PO TABS: 500.0000 mg | ORAL_TABLET | Freq: Every day | ORAL | 1 refills | Status: DC

## 2016-04-03 MED ORDER — TRAMADOL HCL 50 MG PO TABS: 50.0000 mg | ORAL_TABLET | Freq: Two times a day (BID) | ORAL | 5 refills | Status: DC | PRN

## 2016-04-03 MED ORDER — HYDROCODONE-ACETAMINOPHEN 5-325 MG PO TABS: 1.0000 | ORAL_TABLET | Freq: Two times a day (BID) | ORAL | 0 refills | Status: DC | PRN

## 2016-04-03 MED ORDER — ATORVASTATIN CALCIUM 10 MG PO TABS: 10.0000 mg | ORAL_TABLET | Freq: Every day | ORAL | 3 refills | Status: DC

## 2016-04-03 NOTE — Patient Instructions (Addendum)
IF you received an x-ray today, you will receive an invoice from [facility]. Please contact [facility] at [phone] with questions or concerns regarding your invoice.   IF you received labwork today, you will receive an invoice from [facility]. Please contact [facility] at [phone] with questions or concerns regarding your invoice.   Our billing staff will not be able to assist you with questions regarding bills from these companies.  You will be contacted with the lab results as soon as they are available. The fastest way to get your results is to activate your My Chart account. Instructions are located on the last page of this paperwork. If you have not heard from [place] regarding the results in 2 weeks, please contact this office.    Keeping you healthy  Get these tests  Blood pressure- Have your blood pressure checked once a year by your healthcare provider.  Normal blood pressure is 120/80.  Weight- Have your body mass index (BMI) calculated to screen for obesity.  BMI is a measure of body fat based on height and weight. You can also calculate your own BMI at [employer]velBags.it.  Cholesterol- Have your cholesterol checked regularly starting at age 24, sooner may be necessary if you have diabetes, high blood pressure, if a family member developed heart diseases at an early age or if you smoke.   Chlamydia, HIV, and other sexual transmitted disease- Get screened each year until the age of 75 then within three months of each new sexual partner.  Diabetes- Have your blood sugar checked regularly if you have high blood pressure, high cholesterol, a family history of diabetes or if you are overweight.  Get these vaccines  Flu shot- Every fall.  Tetanus shot- Every 10 years.  Menactra- Single dose; prevents meningitis.  Take these steps  Don't smoke- If you do smoke, ask your healthcare provider about quitting. For tips on  how to quit, go to www.smokefree.gov or call 1-800-QUIT-NOW.  Be physically active- Exercise 5 days a week for at least 30 minutes.  If you are not already physically active start slow and gradually work up to 30 minutes of moderate physical activity.  Examples of moderate activity include walking briskly, mowing the yard, dancing, swimming bicycling, etc.  Eat a healthy diet- Eat a variety of healthy foods such as fruits, vegetables, low fat milk, low fat cheese, yogurt, lean meats, poultry, fish, beans, tofu, etc.  For more information on healthy eating, go to www.thenutritionsource.org  Drink alcohol in moderation- Limit alcohol intake two drinks or less a day.  Never drink and drive.  Dentist- Brush and floss teeth twice daily; visit your dentis twice a year.  Depression-Your emotional health is as important as your physical health.  If you're feeling down, losing interest in things you normally enjoy please talk with your healthcare provider.  Gun Safety- If you keep a gun in your home, keep it unloaded and with the safety lock on.  Bullets should be stored separately.  Helmet use- Always wear a helmet when riding a motorcycle, bicycle, rollerblading or skateboarding.  Safe sex- If you may be exposed to a sexually transmitted infection, use a condom  Seat belts- Seat bels can save your life; always wear one.  Smoke/Carbon Monoxide detectors- These detectors need to be installed on the appropriate level of your home.  Replace batteries at least once a year.  Skin Cancer- When out in the sun, cover up and use sunscreen SPF  15 or higher.  Violence- If anyone is threatening or hurting you, please tell your healthcare provider.

## 2016-04-03 NOTE — Progress Notes (Signed)
   Subjective:    Patient ID: Nathan Holder, male    DOB: 1970-09-30, 45 y.o.   MRN: [id number]  HPI    Review of Systems  Constitutional: Negative.   HENT: Negative.   Eyes: Negative.   Respiratory: Negative.   Cardiovascular: Negative.   Gastrointestinal: Negative.   Endocrine: Negative.   Genitourinary: Negative.   Musculoskeletal: Positive for arthralgias, back pain and myalgias.  Skin: Negative.   Allergic/Immunologic: Negative.   Neurological: Negative.   Hematological: Negative.   Psychiatric/Behavioral: Negative.        Objective:   Physical Exam        Assessment & Plan:

## 2016-04-03 NOTE — Progress Notes (Signed)
By signing my name below, I, Mesha Guinyard, attest that this documentation has been prepared under the direction and in the presence of [employer]  Electronically Signed: Verlee Monte, Medical Scribe. 04/03/2016. 3:39 PM.  Subjective:    Patient ID: Nathan Holder, male    DOB: 12/25/1970, 45 y.o.   MRN: [id number]  04/03/2016  Annual Exam (pt would like flu shot) and Medication Refill (all)  HPI  HPI Comments: ORACIO Holder is a 45 y.o. male who presents to the [facility] for his annual physical exam. Pt was last seen in Feb for follow-up and he was recommend to avoid Tylenol and alcohol. Pt was told to come back in April for fasting labs and where they need to be improved, so he came back for blood work and his results were nl. I held his cholesterol medication. I did an ultrasound of his liver and he had a fatty liver. I recommended weight loss.  Liver: Pt has cut back on his alcohol intake and had a lifestyle change and he has 10 drinks and he used to have +20 drinks a week. Pt was sick for 4 months and was abx all through the time. Pt is concerned this may have effected his kidney function. Pt denies chest pain, and palpitations.  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  Asthma: Pt rarely uses his Albuterol inhaler. Pt will use it when he moves the lawn or any strenuous work. Pt snores at night, but doesn't stop breathing while he sleeps. Pt denies wheezing, SOB, and cough.  BM: Pt has regular bm. Pt denies nausea, vomiting, abdominal pain.  Penial Function: Urine stream is good. Pt denies having problems with his sex drive, and ED. Pt doesn't have to use the bathroom in the middle of the night.  Sleep: Pt is waking up around 6 am and goes to get around 9 pm.  Back Pain: Pt would ike to use to get 15 more tramadol. Pt has good day and bad days. Pt has more muscular pain than spinal pain. Pt states he has quick  recovery with the hydrocodone.  Right Body Pain: Pt states when he stopped taking the naproxen in March his right elbow, right thumb, and right knee began to have pain. Pt is left handed and he sleeps on his left side.  Immunizations: Pt would like to get his flu shot today. Pt hasn't had his Hep A vaccine, but would like to get his vaccine. Immunization History  Administered Date(s) Administered  . Hepatitis A, Adult 04/03/2016  . Hepatitis B 08/06/2002  . Influenza Split 08/06/2001, 06/07/2015  . Influenza,inj,Quad PF,36+ Mos 06/01/2013, 04/03/2016  . Influenza-Unspecified 05/06/2014  . Td 08/06/2002  . Tdap 05/03/2014   Vision: Pt just had an appt. His left eye has worsening stigmatism and his right eye is fine. Pt denies hearing loss, tinnitus. Pt has ringing when he hears loud music.   Visual Acuity Screening   Right eye Left eye Both eyes  Without correction: 20/20 20/40 20/20   With correction:      Dentist: Pt goes to the dentist PRN. Pt denies mouth sores that wont heal. Pt did dip for a while in college, but he stopped along time ago.  Exercise: Pt doesn't get in exercise. Pt doesn't stretch but reports he thinks he should. Pt mentions he doesn't have a lot of stamina. Wt Readings from Last 3 Encounters:  04/03/16 Marland Kitchen)  304 lb 9.6 oz (138.2 kg)  10/04/15 (!) 318 lb 3.2 oz (144.3 kg)  06/29/15 (!) 310 lb (140.6 kg)   Depression: Depression screen [facility] 2/9 04/03/2016 10/04/2015 06/29/2015 04/25/2015 05/03/2014  Decreased Interest 0 0 0 0 0  Down, Depressed, Hopeless 0 0 0 0 0  PHQ - 2 Score 0 0 0 0 0   Review of Systems  Constitutional: Negative for activity change, appetite change, chills, diaphoresis, fatigue, fever and unexpected weight change.  HENT: Negative for congestion, dental problem, drooling, ear discharge, ear pain, facial swelling, hearing loss, mouth sores, nosebleeds, postnasal drip, rhinorrhea, sinus pressure, sneezing, sore throat, tinnitus, trouble swallowing  and voice change.   Eyes: Negative for photophobia, pain, discharge, redness, itching and visual disturbance.  Respiratory: Negative for apnea, cough, choking, chest tightness, shortness of breath, wheezing and stridor.   Cardiovascular: Negative for chest pain, palpitations and leg swelling.  Gastrointestinal: Negative for abdominal pain, blood in stool, constipation, diarrhea, nausea and vomiting.  Endocrine: Negative for cold intolerance, heat intolerance, polydipsia, polyphagia and polyuria.  Genitourinary: Negative for decreased urine volume, difficulty urinating, discharge, dysuria, enuresis, flank pain, frequency, genital sores, hematuria, penile pain, penile swelling, scrotal swelling, testicular pain and urgency.  Musculoskeletal: Positive for back pain. Negative for arthralgias, gait problem, joint swelling, myalgias, neck pain and neck stiffness.  Skin: Negative for color change, pallor, rash and wound.  Allergic/Immunologic: Negative for environmental allergies, food allergies and immunocompromised state.  Neurological: Negative for dizziness, tremors, seizures, syncope, facial asymmetry, speech difficulty, weakness, light-headedness, numbness and headaches.  Hematological: Negative for adenopathy. Does not bruise/bleed easily.  Psychiatric/Behavioral: Negative for agitation, behavioral problems, confusion, decreased concentration, dysphoric mood, hallucinations, self-injury, sleep disturbance and suicidal ideas. The patient is not nervous/anxious and is not hyperactive.     Past Medical History:  Diagnosis Date  . Allergy    mold pollen  . Asthma    very mild  . Back pain   . Hyperlipidemia   . NASH (nonalcoholic steatohepatitis)    abdominal US revealed   Past Surgical History:  Procedure Laterality Date  . APPENDECTOMY    . BACK SURGERY    . SPINE SURGERY  08/06/2008   diskectomy L4-5 2010, 2004.   No Known Allergies  Social History   Social History  . Marital  status: Married    Spouse name: N/A  . Number of children: 2  . Years of education: N/A   Occupational History  . teacher     Pre-K teacher   Social History Main Topics  . Smoking status: Former Research scientist (life sciences)  . Smokeless tobacco: Never Used  . Alcohol use 6.0 oz/week    10 Standard drinks or equivalent per week     Comment: drink daily-2 drinks, per Health Survey (05/03/14)  10-16 drinks  . Drug use: No  . Sexual activity: Yes    Partners: Female    Birth control/ protection: None     Comment: number of sex partners in the last 52 months 1   Other Topics Concern  . Not on file   Social History Narrative   Marital status: married x 19 years.      Children: 2 children (93 yo daughter, 25 yo son)      Lives: with wife, 2 children, 2 cats.      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      Tobacco:  None       Alcohol:  1-2  drinks per day; was drinking 20 drinks per week in 2016.      Exercise: none in 2017      Seatbelt: 100%; no texting.      Guns:  Guns lots of them; concealed carrier permit; loaded but secured.     Family History  Problem Relation Age of Onset  . Adopted: Yes       Objective:    BP (!) 140/92 (BP Location: Right Arm, Patient Position: Sitting, Cuff Size: Large)   Pulse 73   Resp 16   Ht 6' (1.829 m)   Wt (!) 304 lb 9.6 oz (138.2 kg)   SpO2 96%   BMI 41.31 kg/m  Physical Exam  Constitutional: He is oriented to person, place, and time. He appears well-developed and well-nourished. No distress.  HENT:  Head: Normocephalic and atraumatic.  Right Ear: External ear normal.  Left Ear: External ear normal.  Nose: Nose normal.  Mouth/Throat: Oropharynx is clear and moist.  Eyes: Conjunctivae and EOM are normal. Pupils are equal, round, and reactive to light.  Neck: Normal range of motion. Neck supple. Carotid bruit is not present. No thyromegaly present.  Cardiovascular: Normal rate, regular rhythm, normal heart  sounds and intact distal pulses.  Exam reveals no gallop and no friction rub.   No murmur heard. Pulmonary/Chest: Effort normal and breath sounds normal. He has no wheezes. He has no rales.  Abdominal: Soft. Bowel sounds are normal. He exhibits no distension and no mass. There is no tenderness. There is no rebound and no guarding.  Genitourinary: Penis normal.  Musculoskeletal:       Right shoulder: Normal.       Left shoulder: Normal.       Cervical back: Normal.  Lymphadenopathy:    He has no cervical adenopathy.  Neurological: He is alert and oriented to person, place, and time. He has normal reflexes. No cranial nerve deficit. He exhibits normal muscle tone. Coordination normal.  Skin: Skin is warm and dry. No rash noted. He is not diaphoretic.  Psychiatric: He has a normal mood and affect. His behavior is normal. Judgment and thought content normal.         Assessment & Plan:   1. Routine physical examination   2. Degenerative disc disease, lumbar   3. Obesity   4. Pure hypercholesterolemia   5. Chronic lower back pain   6. Screening for diabetes mellitus   7. Screening for prostate cancer   8. Need for prophylactic vaccination and inoculation against influenza   9. NASH (nonalcoholic steatohepatitis)   10. Need for prophylactic vaccination and inoculation against viral hepatitis   11. Asthma, chronic, mild intermittent, uncomplicated   12. Phimosis     Orders Placed This Encounter  Procedures  . Flu Vaccine QUAD 36+ mos IM  . Hepatitis A vaccine adult IM  . CBC with Differential/Platelet  . Comprehensive metabolic panel    Order Specific Question:   Has the patient fasted?    Answer:   Yes  . Lipid panel    Order Specific Question:   Has the patient fasted?    Answer:   Yes  . PSA  . Hemoglobin A1c  . POCT urinalysis dipstick  . POCT Microscopic Urinalysis (UMFC)   Meds ordered this encounter  Medications  . traMADol (ULTRAM) 50 MG tablet    Sig: Take 1  tablet (50 mg total) by mouth every 12 (twelve) hours as needed.    Dispense:  75 tablet  Refill:  5  . albuterol (VENTOLIN HFA) 108 (90 Base) MCG/ACT inhaler    Sig: Inhale 2 puffs into the lungs every 6 (six) hours as needed for wheezing.    Dispense:  1 Inhaler    Refill:  2  . cyclobenzaprine (FLEXERIL) 5 MG tablet    Sig: Take 1 tablet (5 mg total) by mouth 3 (three) times daily as needed for muscle spasms.    Dispense:  270 tablet    Refill:  1  . atorvastatin (LIPITOR) 10 MG tablet    Sig: Take 1 tablet (10 mg total) by mouth daily.    Dispense:  90 tablet    Refill:  3  . HYDROcodone-acetaminophen (NORCO/VICODIN) 5-325 MG tablet    Sig: Take 1 tablet by mouth 2 (two) times daily as needed for moderate pain or severe pain.    Dispense:  45 tablet    Refill:  0  . DISCONTD: nystatin-triamcinolone ointment (MYCOLOG)    Sig: Apply 1 application topically 2 (two) times daily.    Dispense:  30 g    Refill:  3  . naproxen (NAPROSYN) 500 MG tablet    Sig: Take 1 tablet (500 mg total) by mouth daily.    Dispense:  90 tablet    Refill:  1  . nystatin-triamcinolone ointment (MYCOLOG)    Sig: Apply 1 application topically 2 (two) times daily.    Dispense:  30 g    Refill:  3    Return in about 6 months (around 10/03/2016) for recheck high cholesterol, chronic low back pain.  I personally performed the services described in this documentation, which was scribed in my presence. The recorded information has been reviewed and considered.  Varun Jourdan Elayne Guerin, M.D. [facility] [address] [place], [place]  [address] [phone] phone [phone] fax

## 2016-04-04 LAB — COMPREHENSIVE METABOLIC PANEL
ALK PHOS: 67 U/L (ref 40–115)
ALT: 37 U/L (ref 9–46)
AST: 20 U/L (ref 10–40)
Albumin: 4.4 g/dL (ref 3.6–5.1)
BUN: 13 mg/dL (ref 7–25)
CO2: 24 mmol/L (ref 20–31)
Calcium: 9.6 mg/dL (ref 8.6–10.3)
Chloride: 104 mmol/L (ref 98–110)
Creat: 0.98 mg/dL (ref 0.60–1.35)
GLUCOSE: 101 mg/dL — AB (ref 65–99)
POTASSIUM: 5.1 mmol/L (ref 3.5–5.3)
Sodium: 140 mmol/L (ref 135–146)
Total Bilirubin: 0.8 mg/dL (ref 0.2–1.2)
Total Protein: 7.2 g/dL (ref 6.1–8.1)

## 2016-04-04 LAB — LIPID PANEL
CHOLESTEROL: 176 mg/dL (ref 125–200)
HDL: 53 mg/dL (ref >=40)
LDL Cholesterol: 103 mg/dL (ref <130)
TRIGLYCERIDES: 101 mg/dL (ref <150)
Total CHOL/HDL Ratio: 3.3 Ratio (ref <=5.0)
VLDL: 20 mg/dL (ref <30)

## 2016-04-04 LAB — PSA: PSA: 0.5 ng/mL (ref <=4.0)

## 2016-06-25 IMAGING — US US ABDOMEN COMPLETE
1 series · 14 of 25 positions shown · non-contrast
Comparison: None.

CLINICAL DATA: Elevated liver function tests.

EXAM:
ABDOMEN ULTRASOUND COMPLETE

[Series 1: us abdomen complete · 0.31mm/px · 14 of 72 slices shown]
[im 1/72]
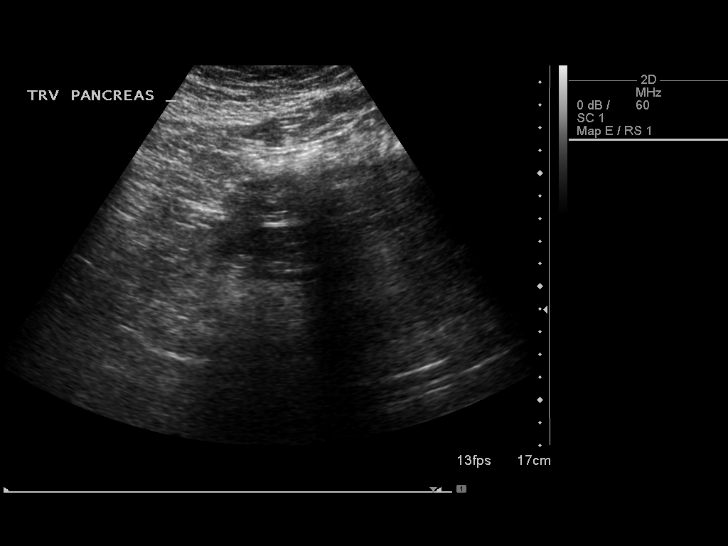
[im 6/72]
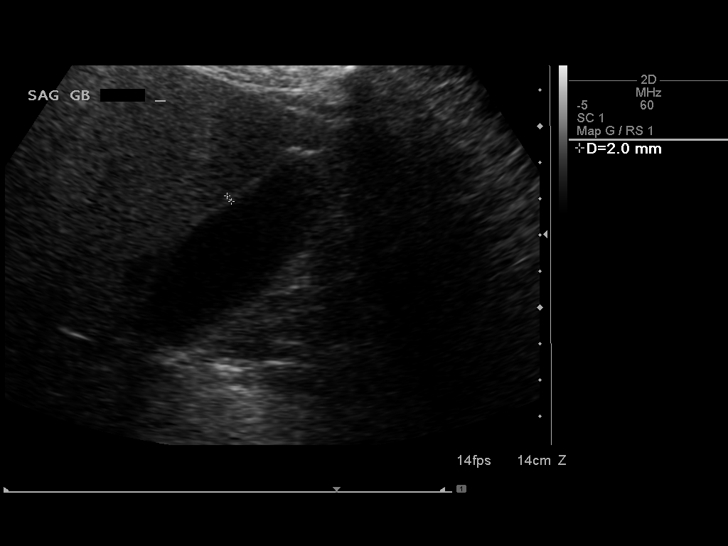
[im 12/72]
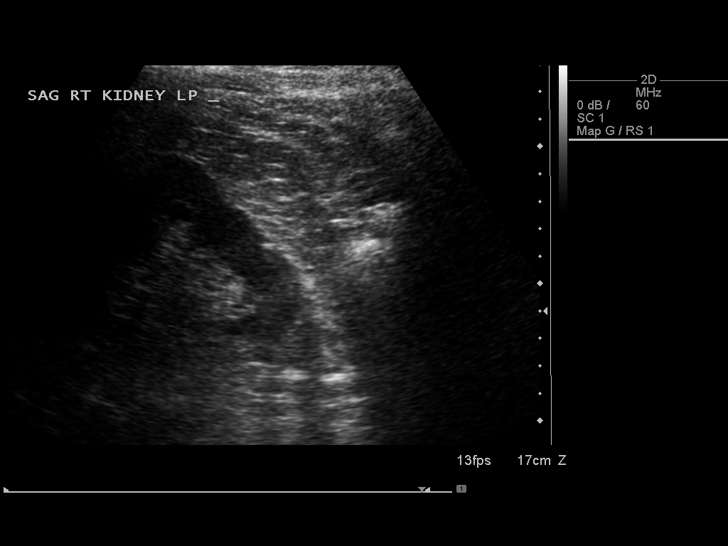
[im 18/72]
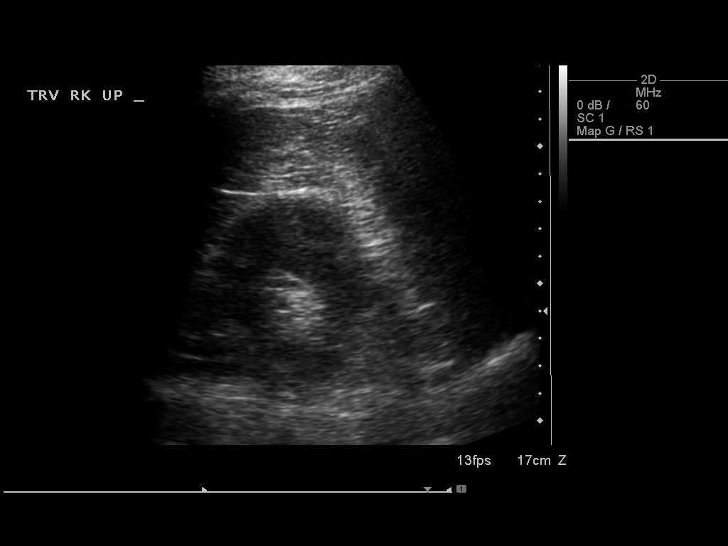
[im 24/72]
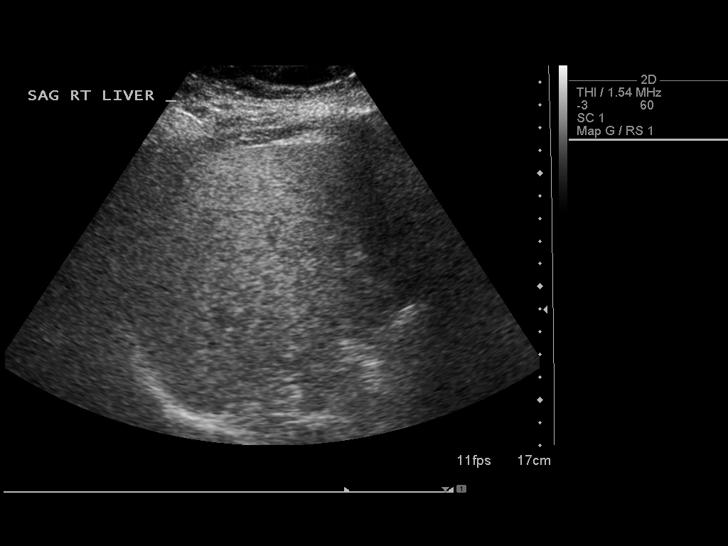
[im 27/72]
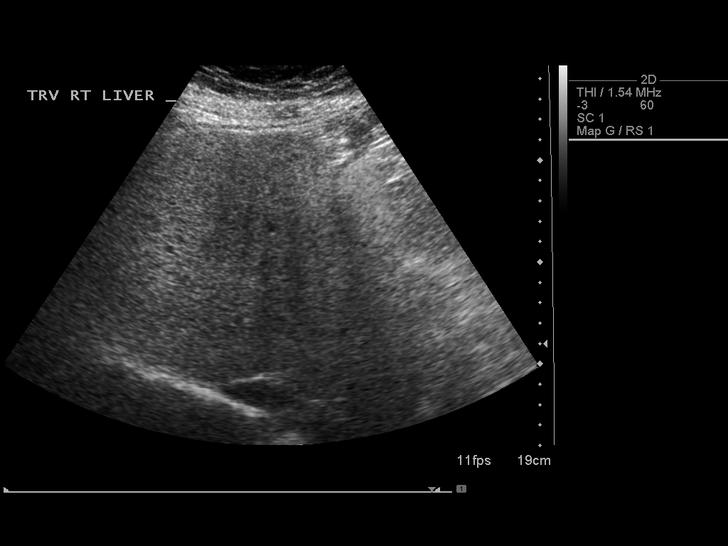
[im 33/72]
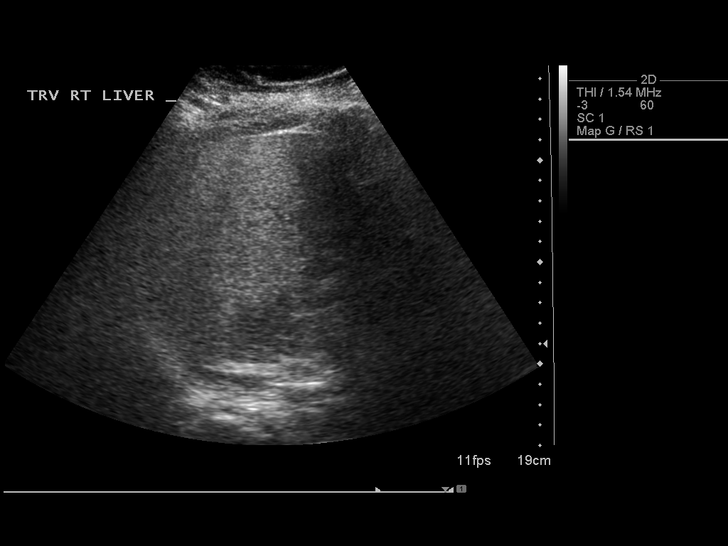
[im 39/72]
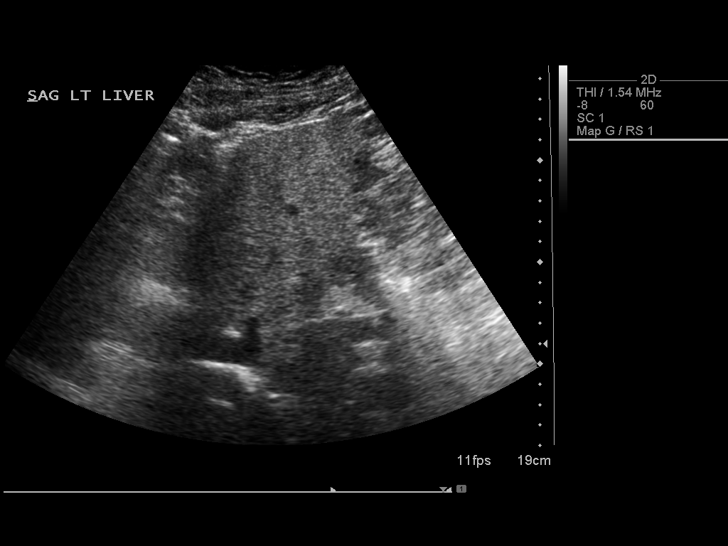
[im 45/72]
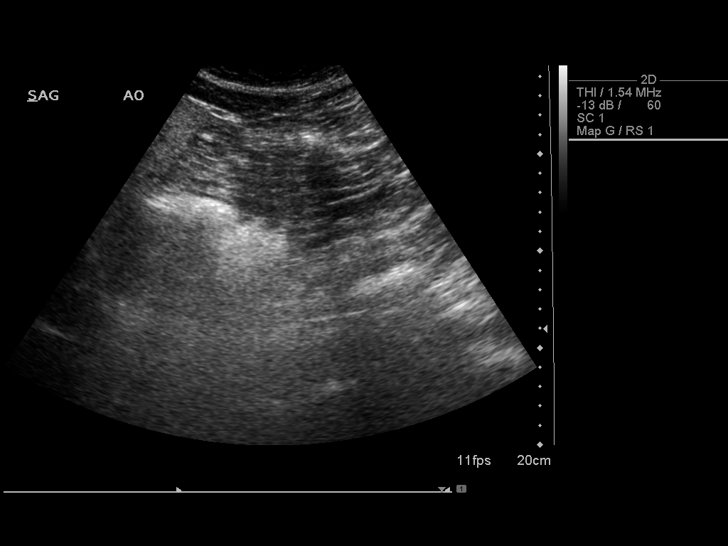
[im 48/72]
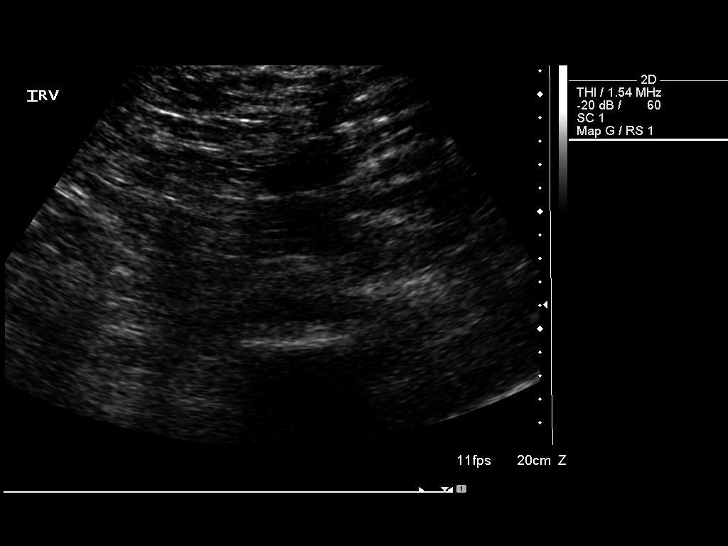
[im 54/72]
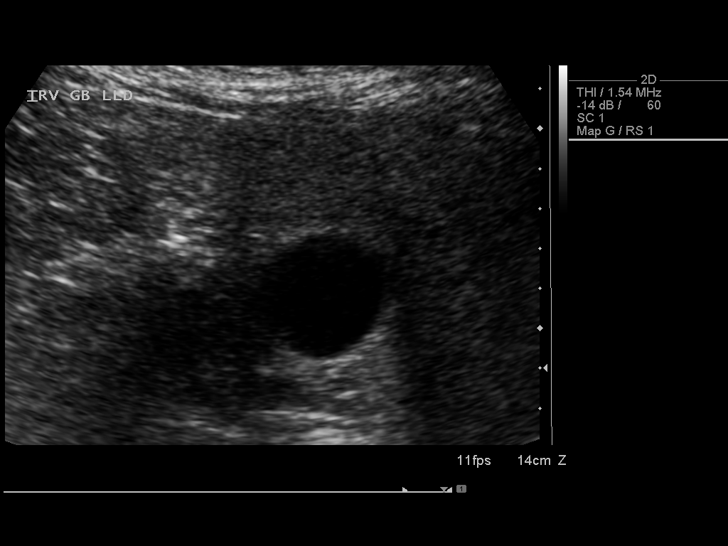
[im 60/72]
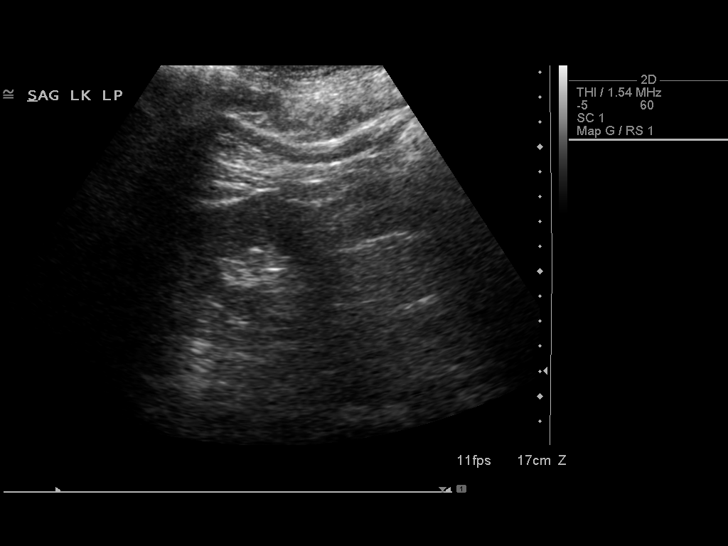
[im 66/72]
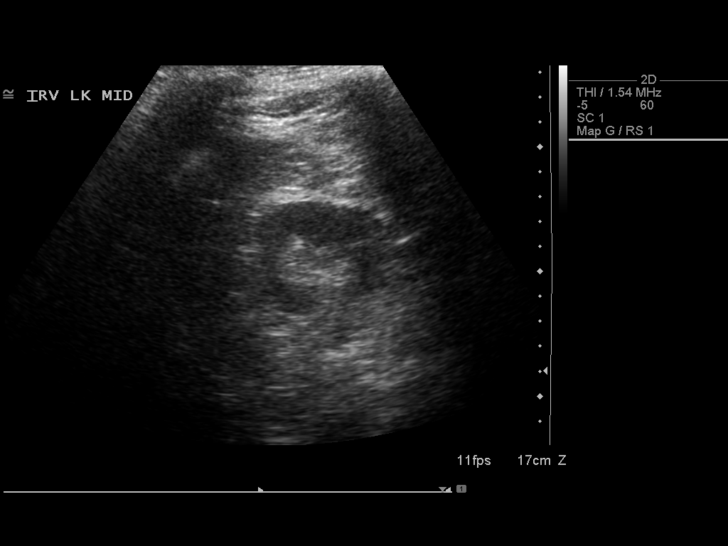
[im 72/72]
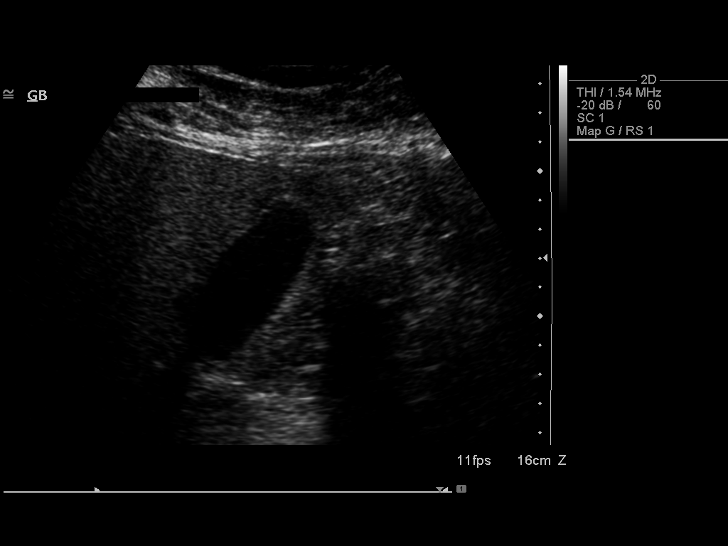

[14 of 25 positions shown; findings below may reference images not displayed]

FINDINGS: Gallbladder: No gallstones or wall thickening visualized. No
sonographic Murphy sign noted by sonographer.

Common bile duct: Diameter: 5.8 mm which is within normal limits.

Liver: No focal lesion identified. Increased echogenicity is noted
suggesting fatty infiltration.

IVC: No abnormality visualized.

Pancreas: Not visualized due to overlying bowel gas and body
habitus.

Spleen: Size and appearance within normal limits.

Right Kidney: Length: 11.5 cm. Echogenicity within normal limits. No
mass or hydronephrosis visualized.

Left Kidney: Length: 12 cm. Echogenicity within normal limits. No
mass or hydronephrosis visualized.

Abdominal aorta: No aneurysm visualized.

Other findings: None.
IMPRESSION: Increased echogenicity of hepatic parenchyma is noted suggesting
fatty infiltration. Pancreas is not visualized due to overlying
bowel gas and body habitus. No other abnormality seen in the
abdomen.

## 2016-06-29 ENCOUNTER — Ambulatory Visit (INDEPENDENT_AMBULATORY_CARE_PROVIDER_SITE_OTHER): Payer: BC Managed Care – PPO | Admitting: Family Medicine

## 2016-06-29 VITALS — BP 138/82 | HR 87 | Temp 98.5°F | Resp 16 | Ht 72.0 in | Wt 307.0 lb

## 2016-06-29 DIAGNOSIS — M722 Plantar fascial fibromatosis: Secondary | ICD-10-CM

## 2016-06-29 MED ORDER — NAPROXEN 500 MG PO TABS: 500.0000 mg | ORAL_TABLET | Freq: Every day | ORAL | 1 refills | Status: DC

## 2016-06-29 NOTE — Patient Instructions (Addendum)
Take naproxen 500 mg twice daily. May increase frequency of Tramadol 50 mg every 8 hours as needed for foot pain.  Follow-up with [facility] here in [place] for fitting of custom orthotic.  Follow-up as needed.  Carroll Sage. Kenton Kingfisher, MSN, FNP-C [facility]    IF you received an x-ray today, you will receive an invoice from [facility]. Please contact [facility] at [phone] with questions or concerns regarding your invoice.   IF you received labwork today, you will receive an invoice from [employer]. Please contact [facility] at [phone] with questions or concerns regarding your invoice.   Our billing staff will not be able to assist you with questions regarding bills from these companies.  You will be contacted with the lab results as soon as they are available. The fastest way to get your results is to activate your My Chart account. Instructions are located on the last page of this paperwork. If you have not heard from [place] regarding the results in 2 weeks, please contact this office.

## 2016-06-29 NOTE — Progress Notes (Signed)
Patient ID: Nathan Holder, male    DOB: 1970/08/19, 45 y.o.   MRN: [id number]  PCP: Reginia Forts, MD  Chief Complaint  Patient presents with  . Foot Pain    Left heel, x 2 weeks    Subjective:   HPI 45 year old male presents for evaluation of left heel pain times 2 weeks. Patient is established here at [facility]. 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 Over the last 2 weeks, he describes an intermittent, sharp pain with a continual aching in left heel.  This pain is worsened by standing. Pain at times radiates upward to his left achilles tendons. He has changed shoes, used in-soles, foot soaks and foot massager without relief of pain.  Social History   Social History  . Marital status: Married    Spouse name: N/A  . Number of children: 2  . Years of education: N/A   Occupational History  . teacher     Pre-K teacher   Social History Main Topics  . Smoking status: Former Research scientist (life sciences)  . Smokeless tobacco: Never Used  . Alcohol use 6.0 oz/week    10 Standard drinks or equivalent per week     Comment: drink daily-2 drinks, per Health Survey (05/03/14)  10-16 drinks  . Drug use: No  . Sexual activity: Yes    Partners: Female    Birth control/ protection: None     Comment: number of sex partners in the last 48 months 1   Other Topics Concern  . Not on file   Social History Narrative   Marital status: married x 19 years.      Children: 2 children (18 yo daughter, 8 yo son)      Lives: with wife, 2 children, 2 cats.      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      Tobacco:  None       Alcohol:  1-2 drinks per day; was drinking 20 drinks per week in 2016.      Exercise: none in 2017      Seatbelt: 100%; no texting.      Guns:  Guns lots of them; concealed carrier permit; loaded but secured.      Family History  Problem Relation Age of Onset  . Adopted: Yes   Review of Systems See  HPI  Patient Active Problem List   Diagnosis Date Noted  . Degenerative disc disease, lumbar 05/30/2015  . Obesity 04/05/2014  . Pure hypercholesterolemia 04/05/2014  . Chronic lower back pain 04/05/2014     Prior to Admission medications   Medication Sig Start Date End Date Taking? Authorizing Provider  albuterol (VENTOLIN HFA) 108 (90 Base) MCG/ACT inhaler Inhale 2 puffs into the lungs every 6 (six) hours as needed for wheezing. 04/03/16  Yes [name], MD  atorvastatin (LIPITOR) 10 MG tablet Take 1 tablet (10 mg total) by mouth daily. 04/03/16  Yes [name], MD  cyclobenzaprine (FLEXERIL) 5 MG tablet Take 1 tablet (5 mg total) by mouth 3 (three) times daily as needed for muscle spasms. 04/03/16  Yes [name], MD  fish oil-omega-3 fatty acids 1000 MG capsule Take 2 g by mouth daily.   Yes Historical Provider, MD  Flaxseed, Linseed, (FLAX SEEDS PO) Take by mouth.   Yes Historical Provider, MD  HYDROcodone-acetaminophen (NORCO/VICODIN) 5-325 MG tablet Take 1 tablet by mouth 2 (two) times daily  as needed for moderate pain or severe pain. 04/03/16  Yes [name], MD  naproxen (NAPROSYN) 500 MG tablet Take 1 tablet (500 mg total) by mouth daily. 04/03/16  Yes [name], MD  nystatin-triamcinolone ointment [facility]) Apply 1 application topically 2 (two) times daily. 04/03/16  Yes [name], MD  OVER THE COUNTER MEDICATION Vitamin B6 taken twice a day   Yes Historical Provider, MD  traMADol (ULTRAM) 50 MG tablet Take 1 tablet (50 mg total) by mouth every 12 (twelve) hours as needed. 04/03/16  Yes [name], MD   No Known Allergies    Objective:  Physical Exam  Constitutional: He appears well-developed and well-nourished.  HENT:  Head: Normocephalic and atraumatic.  Eyes: Pupils are equal, round, and reactive to light.  Neck: Normal range of motion.  Cardiovascular: Normal rate, regular rhythm, normal heart sounds and intact distal pulses.   Pulmonary/Chest:  Effort normal and breath sounds normal.  Musculoskeletal:       Left foot: There is tenderness.       Feet:  Neurological: He is alert. GCS eye subscore is 4. GCS verbal subscore is 5. GCS motor subscore is 6.  Reflex Scores:      Achilles reflexes are 2+ on the left side. Skin: Skin is warm and dry.  Psychiatric: He has a normal mood and affect. His behavior is normal. Judgment and thought content normal.   Vitals:   06/29/16 0911  BP: 138/82  Pulse: 87  Resp: 16  Temp: 98.5 F (36.9 C)     Assessment & Plan:  1. Plantar fasciitis, left heel only Plan: Naproxen 500 mg twice daily for 14 days with food. May increase frequency of Tramadol to every 8 hours as needed for foot and back pain. [facility].  Return for follow-up here as needed.  Carroll Sage. Kenton Kingfisher, MSN, FNP-C [facility]

## 2016-07-28 ENCOUNTER — Ambulatory Visit (INDEPENDENT_AMBULATORY_CARE_PROVIDER_SITE_OTHER): Payer: BC Managed Care – PPO | Admitting: Family Medicine

## 2016-07-28 VITALS — BP 130/88 | HR 91 | Temp 98.2°F | Resp 17 | Ht 72.0 in | Wt 309.0 lb

## 2016-07-28 DIAGNOSIS — M722 Plantar fascial fibromatosis: Secondary | ICD-10-CM

## 2016-07-28 MED ORDER — MELOXICAM 15 MG PO TABS: 15.0000 mg | ORAL_TABLET | Freq: Every day | ORAL | 2 refills | Status: DC

## 2016-07-28 NOTE — Progress Notes (Signed)
Subjective:    Patient ID: Nathan Holder, male    DOB: [birth year], 45 y.o.   MRN: [id number]  07/28/2016  Foot Problem (Plantar fasciitis? x69month )   HPI This 45y.o. male presents for one month follow-up persistent heel pain.  Evaluated by FWendelyn Breslowon 06/29/16; dx with plantar fasciitis; has been wearing supportive shoes.  Has changed shoes.  [place].    Review of Systems  Constitutional: Negative for chills, diaphoresis, fatigue and fever.  Musculoskeletal: Positive for arthralgias, gait problem and myalgias. Negative for joint swelling.  Neurological: Negative for weakness and numbness.    Past Medical History:  Diagnosis Date  . Allergy    mold pollen  . Asthma    very mild  . Back pain   . Hyperlipidemia   . NASH (nonalcoholic steatohepatitis)    abdominal uKorearevealed   Past Surgical History:  Procedure Laterality Date  . APPENDECTOMY    . BACK SURGERY    . SPINE SURGERY  08/06/2008   diskectomy L4-5 2010, 2004.   No Known Allergies  Social History   Social History  . Marital status: Married    Spouse name: N/A  . Number of children: 2  . Years of education: N/A   Occupational History  . teacher     Pre-K teacher   Social History Main Topics  . Smoking status: Former SResearch scientist (life sciences) . Smokeless tobacco: Never Used  . Alcohol use 6.0 oz/week    10 Standard drinks or equivalent per week     Comment: drink daily-2 drinks, per Health Survey (05/03/14)  10-16 drinks  . Drug use: No  . Sexual activity: Yes    Partners: Female    Birth control/ protection: None     Comment: number of sex partners in the last 147 months1   Other Topics Concern  . Not on file   Social History Narrative   Marital status: married x 19 years.      Children: 2 children ((26yo daughter, 181yo son)      Lives: with wife, 2 children, 2 cats.      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      Tobacco:  None       Alcohol:  1-2 drinks per day; was drinking 20 drinks per week in 2016.      Exercise: none in 2017      Seatbelt: 100%; no texting.      Guns:  Guns lots of them; concealed carrier permit; loaded but secured.     Family History  Problem Relation Age of Onset  . Adopted: Yes       Objective:    BP 130/88 (BP Location: Left Arm, Patient Position: Sitting, Cuff Size: Large)   Pulse 91   Temp 98.2 F (36.8 C) (Oral)   Resp 17   Ht 6' (1.829 m)   Wt (!) 309 lb (140.2 kg)   SpO2 97%   BMI 41.91 kg/m  Physical Exam  Constitutional: He is oriented to person, place, and time. He appears well-developed and well-nourished. No distress.  HENT:  Head: Normocephalic and atraumatic.  Eyes: Conjunctivae and EOM are normal. Pupils are equal, round, and reactive to light.  Neck: Normal range of motion. Neck supple. Carotid bruit is not present. No thyromegaly present.  Cardiovascular: Normal rate, regular rhythm, normal heart sounds and intact  distal pulses.  Exam reveals no gallop and no friction rub.   No murmur heard. Pulmonary/Chest: Effort normal and breath sounds normal. He has no wheezes. He has no rales.  Musculoskeletal:       Left ankle: Normal. Achilles tendon exhibits no pain, no defect and normal Thompson's test results.       Left foot: There is normal range of motion, no tenderness, no bony tenderness, no swelling, normal capillary refill and no crepitus.  L foot: non-tender with palpation L heel; metatarsal squeeze negative.   Lymphadenopathy:    He has no cervical adenopathy.  Neurological: He is alert and oriented to person, place, and time. No cranial nerve deficit.  Skin: Skin is warm and dry. No rash noted. He is not diaphoretic.  Psychiatric: He has a normal mood and affect. His behavior is normal.  Nursing note and vitals reviewed.       Assessment & Plan:   1. Plantar fasciitis, left    -persistent; refer to podiatry due to  upcoming trip to [place]. -recommend icing qhs for 15 minutes -purchase heel cups to insert into shoes. -rx for Meloxicam provided to use daily. -avoid walking barefoot   Orders Placed This Encounter  Procedures  . Ambulatory referral to Podiatry    Referral Priority:   Routine    Referral Type:   Consultation    Referral Reason:   Specialty Services Required    Requested Specialty:   Podiatry    Number of Visits Requested:   1  . Care order/instruction:    AVS and GO    Scheduling Instructions:     AVS and GO   Meds ordered this encounter  Medications  . meloxicam (MOBIC) 15 MG tablet    Sig: Take 1 tablet (15 mg total) by mouth daily.    Dispense:  30 tablet    Refill:  2    No Follow-up on file.   Lucius Wise Elayne Guerin, M.D. [facility] [address] [place], [place]  [address] [phone] phone [phone] fax

## 2016-07-28 NOTE — Patient Instructions (Addendum)
Add icing twice daily.    IF you received an x-ray today, you will receive an invoice from [facility]iology. Please contact [facility]iology at [phone] with questions or concerns regarding your invoice.   IF you received labwork today, you will receive an invoice from [facility]. Please contact [facility] at [phone] with questions or concerns regarding your invoice.   Our billing staff will not be able to assist you with questions regarding bills from these companies.  You will be contacted with the lab results as soon as they are available. The fastest way to get your results is to activate your My Chart account. Instructions are located on the last page of this paperwork. If you have not heard from [place] regarding the results in 2 weeks, please contact this office.      Plantar Fasciitis Rehab Ask your health care provider which exercises are safe for you. Do exercises exactly as told by your health care provider and adjust them as directed. It is normal to feel mild stretching, pulling, tightness, or discomfort as you do these exercises, but you should stop right away if you feel sudden pain or your pain gets worse. Do not begin these exercises until told by your health care provider. Stretching and range of motion exercises These exercises warm up your muscles and joints and improve the movement and flexibility of your foot. These exercises also help to relieve pain. Exercise A: Plantar fascia stretch 1. Sit with your left / right leg crossed over your opposite knee. 2. Hold your heel with one hand with that thumb near your arch. With your other hand, hold your toes and gently pull them back toward the top of your foot. You should feel a stretch on the bottom of your toes or your foot or both. 3. Hold this stretch for__________ seconds. 4. Slowly release your toes and return to the starting position. Repeat __________ times. Complete this exercise __________ times a  day. Exercise B: Gastroc, standing 1. Stand with your hands against a wall. 2. Extend your left / right leg behind you, and bend your front knee slightly. 3. Keeping your heels on the floor and keeping your back knee straight, shift your weight toward the wall without arching your back. You should feel a gentle stretch in your left / right calf. 4. Hold this position for __________ seconds. Repeat __________ times. Complete this exercise __________ times a day. Exercise C: Soleus, standing 1. Stand with your hands against a wall. 2. Extend your left / right leg behind you, and bend your front knee slightly. 3. Keeping your heels on the floor, bend your back knee and slightly shift your weight over the back leg. You should feel a gentle stretch deep in your calf. 4. Hold this position for __________ seconds. Repeat __________ times. Complete this exercise __________ times a day. Exercise D: Gastrocsoleus, standing 1. Stand with the ball of your left / right foot on a step. The ball of your foot is on the walking surface, right under your toes. 2. Keep your other foot firmly on the same step. 3. Hold onto the wall or a railing for balance. 4. Slowly lift your other foot, allowing your body weight to press your heel down over the edge of the step. You should feel a stretch in your left / right calf. 5. Hold this position for __________ seconds. 6. Return both feet to the step. 7. Repeat this exercise with a slight bend in your left / right  knee. Repeat __________ times with your left / right knee straight and __________ times with your left / right knee bent. Complete this exercise __________ times a day. Balance exercise This exercise builds your balance and strength control of your arch to help take pressure off your plantar fascia. Exercise E: Single leg stand 1. Without shoes, stand near a railing or in a doorway. You may hold onto the railing or door frame as needed. 2. Stand on your left  / right foot. Keep your big toe down on the floor and try to keep your arch lifted. Do not let your foot roll inward. 3. Hold this position for __________ seconds. 4. If this exercise is too easy, you can try it with your eyes closed or while standing on a pillow. Repeat __________ times. Complete this exercise __________ times a day. This information is not intended to replace advice given to you by your health care provider. Make sure you discuss any questions you have with your health care provider. Document Released: 07/23/2005 Document Revised: 03/27/2016 Document Reviewed: 06/06/2015 Elsevier Interactive Patient Education  2017 [employer].

## 2016-10-16 ENCOUNTER — Encounter: Payer: Self-pay | Admitting: Family Medicine

## 2016-10-16 ENCOUNTER — Ambulatory Visit (INDEPENDENT_AMBULATORY_CARE_PROVIDER_SITE_OTHER): Payer: BC Managed Care – PPO | Admitting: Family Medicine

## 2016-10-16 VITALS — BP 126/83 | HR 78 | Temp 98.4°F | Resp 18 | Ht 72.0 in | Wt 296.0 lb

## 2016-10-16 DIAGNOSIS — IMO0001 Reserved for inherently not codable concepts without codable children: Secondary | ICD-10-CM

## 2016-10-16 DIAGNOSIS — E6609 Other obesity due to excess calories: Secondary | ICD-10-CM | POA: Diagnosis not present

## 2016-10-16 DIAGNOSIS — Z23 Encounter for immunization: Secondary | ICD-10-CM

## 2016-10-16 DIAGNOSIS — E7800 Pure hypercholesterolemia, unspecified: Secondary | ICD-10-CM

## 2016-10-16 DIAGNOSIS — G8929 Other chronic pain: Secondary | ICD-10-CM | POA: Diagnosis not present

## 2016-10-16 DIAGNOSIS — M545 Low back pain, unspecified: Secondary | ICD-10-CM

## 2016-10-16 DIAGNOSIS — Z6841 Body Mass Index (BMI) 40.0 and over, adult: Secondary | ICD-10-CM | POA: Diagnosis not present

## 2016-10-16 DIAGNOSIS — K7581 Nonalcoholic steatohepatitis (NASH): Secondary | ICD-10-CM

## 2016-10-16 DIAGNOSIS — M5136 Other intervertebral disc degeneration, lumbar region: Secondary | ICD-10-CM | POA: Diagnosis not present

## 2016-10-16 MED ORDER — TRAMADOL HCL 50 MG PO TABS: 50.0000 mg | ORAL_TABLET | Freq: Two times a day (BID) | ORAL | 5 refills | Status: DC | PRN

## 2016-10-16 MED ORDER — MELOXICAM 15 MG PO TABS: 15.0000 mg | ORAL_TABLET | Freq: Every day | ORAL | 1 refills | Status: DC

## 2016-10-16 MED ORDER — HYDROCODONE-ACETAMINOPHEN 5-325 MG PO TABS: 1.0000 | ORAL_TABLET | Freq: Two times a day (BID) | ORAL | 0 refills | Status: DC | PRN

## 2016-10-16 NOTE — Progress Notes (Signed)
Subjective:    Patient ID: Nathan Holder, male    DOB: [birth year], 46 y.o.   MRN: [id number]  10/16/2016  Medication Refill (All RX)   HPI This 46 y.o. 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  Patient reports good compliance with medication, good tolerance to medication, and good symptom control.  Plantar fasciitis has improved from last visit but has persisted.  Has lost 21 pounds since January 2018.  Doing well with low-carb diet.  Has really decreased medication intake with weight loss.  No sugar; no white flour. No pop.  Inserts in shoes have helped with back pain.    Immunization History  Administered Date(s) Administered  . Hepatitis A, Adult 04/03/2016, 10/16/2016  . Hepatitis B 08/06/2002  . Influenza Split 08/06/2001, 06/07/2015  . Influenza,inj,Quad PF,36+ Mos 06/01/2013, 04/03/2016  . Influenza-Unspecified 05/06/2014  . Td 08/06/2002  . Tdap 05/03/2014   Wt Readings from Last 3 Encounters:  10/16/16 296 lb (134.3 kg)  07/28/16 (!) 309 lb (140.2 kg)  06/29/16 (!) 307 lb (139.3 kg)    Review of Systems  Constitutional: Negative for activity change, appetite change, chills, diaphoresis, fatigue and fever.  Eyes: Negative for visual disturbance.  Respiratory: Negative for cough and shortness of breath.   Cardiovascular: Negative for chest pain, palpitations and leg swelling.  Endocrine: Negative for cold intolerance, heat intolerance, polydipsia, polyphagia and polyuria.  Musculoskeletal: Positive for back pain and myalgias.  Neurological: Negative for dizziness, tremors, seizures, syncope, facial asymmetry, speech difficulty, weakness, light-headedness, numbness and headaches.    Past Medical History:  Diagnosis Date  . Allergy    mold pollen  . Asthma    very mild  . Back pain   . Hyperlipidemia   . NASH (nonalcoholic steatohepatitis)    abdominal US revealed   Past Surgical  History:  Procedure Laterality Date  . APPENDECTOMY    . BACK SURGERY    . SPINE SURGERY  08/06/2008   diskectomy L4-5 2010, 2004.   No Known Allergies Current Outpatient Prescriptions  Medication Sig Dispense Refill  . albuterol (VENTOLIN HFA) 108 (90 Base) MCG/ACT inhaler Inhale 2 puffs into the lungs every 6 (six) hours as needed for wheezing. 1 Inhaler 2  . atorvastatin (LIPITOR) 10 MG tablet Take 1 tablet (10 mg total) by mouth daily. 90 tablet 3  . fish oil-omega-3 fatty acids 1000 MG capsule Take 2 g by mouth daily.    . Flaxseed, Linseed, (FLAX SEEDS PO) Take by mouth.    Marland Kitchen HYDROcodone-acetaminophen (NORCO/VICODIN) 5-325 MG tablet Take 1 tablet by mouth 2 (two) times daily as needed for moderate pain or severe pain. 45 tablet 0  . meloxicam (MOBIC) 15 MG tablet Take 1 tablet (15 mg total) by mouth daily. 90 tablet 1  . naproxen (NAPROSYN) 500 MG tablet Take 1 tablet (500 mg total) by mouth daily. 90 tablet 1  . nystatin-triamcinolone ointment (MYCOLOG) Apply 1 application topically 2 (two) times daily. 30 g 3  . OVER THE COUNTER MEDICATION Vitamin B6 taken twice a day    . traMADol (ULTRAM) 50 MG tablet Take 1 tablet (50 mg total) by mouth every 12 (twelve) hours as needed. 60 tablet 5  . cyclobenzaprine (FLEXERIL) 5 MG tablet TAKE 1 TABLET (5 MG TOTAL) BY MOUTH 3 (THREE) TIMES DAILY AS NEEDED FOR MUSCLE SPASMS. 270 tablet 1   No current facility-administered medications for this visit.    Social  History   Social History  . Marital status: Married    Spouse name: N/A  . Number of children: 2  . Years of education: N/A   Occupational History  . teacher     Pre-K teacher   Social History Main Topics  . Smoking status: Former Research scientist (life sciences)  . Smokeless tobacco: Never Used  . Alcohol use 6.0 oz/week    10 Standard drinks or equivalent per week     Comment: drink daily-2 drinks, per Health Survey (05/03/14)  10-16 drinks  . Drug use: No  . Sexual activity: Yes    Partners: Female     Birth control/ protection: None     Comment: number of sex partners in the last 57 months 1   Other Topics Concern  . Not on file   Social History Narrative   Marital status: married x 19 years.      Children: 2 children (51 yo daughter, 9 yo son)      Lives: with wife, 2 children, 2 cats.      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      Tobacco:  None       Alcohol:  1-2 drinks per day; was drinking 20 drinks per week in 2016.      Exercise: none in 2017      Seatbelt: 100%; no texting.      Guns:  Guns lots of them; concealed carrier permit; loaded but secured.     Family History  Problem Relation Age of Onset  . Adopted: Yes       Objective:    BP 126/83   Pulse 78   Temp 98.4 F (36.9 C) (Oral)   Resp 18   Ht 6' (1.829 m)   Wt 296 lb (134.3 kg)   SpO2 97%   BMI 40.14 kg/m  Physical Exam  Constitutional: He is oriented to person, place, and time. He appears well-developed and well-nourished. No distress.  HENT:  Head: Normocephalic and atraumatic.  Right Ear: External ear normal.  Left Ear: External ear normal.  Nose: Nose normal.  Mouth/Throat: Oropharynx is clear and moist.  Eyes: Conjunctivae and EOM are normal. Pupils are equal, round, and reactive to light.  Neck: Normal range of motion. Neck supple. Carotid bruit is not present. No thyromegaly present.  Cardiovascular: Normal rate, regular rhythm, normal heart sounds and intact distal pulses.  Exam reveals no gallop and no friction rub.   No murmur heard. Pulmonary/Chest: Effort normal and breath sounds normal. He has no wheezes. He has no rales.  Abdominal: Soft. Bowel sounds are normal. He exhibits no distension and no mass. There is no tenderness. There is no rebound and no guarding.  Musculoskeletal:       Lumbar back: Normal.  Lymphadenopathy:    He has no cervical adenopathy.  Neurological: He is alert and oriented to person, place, and time. No  cranial nerve deficit.  Skin: Skin is warm and dry. No rash noted. He is not diaphoretic.  Psychiatric: He has a normal mood and affect. His behavior is normal.  Nursing note and vitals reviewed.  Depression screen [facility] 2/9 10/16/2016 07/28/2016 06/29/2016 04/03/2016 10/04/2015  Decreased Interest 0 0 0 0 0  Down, Depressed, Hopeless 0 - 0 0 0  PHQ - 2 Score 0 0 0 0 0   Fall Risk  10/16/2016 07/28/2016 06/29/2016 04/03/2016 10/04/2015  Falls in the past year? No No No No  No  Number falls in past yr: - - - - -        Assessment & Plan:   1. Pure hypercholesterolemia   2. Degenerative disc disease, lumbar   3. Class 3 obesity due to excess calories with serious comorbidity and body mass index (BMI) of 40.0 to 44.9 in adult ([place])   4. Chronic midline low back pain without sciatica   5. Chronic bilateral low back pain without sciatica   6. NASH (nonalcoholic steatohepatitis)   7. Need for prophylactic vaccination and inoculation against viral hepatitis    -congratulations on weight loss. -obtain labs. -s/p Hepatitis A#2 for NASH: highly recommend alcohol avoidance and ongoing weight loss. -refill of medications provided. -continue with low-carbohydrate diet with emphasis on exercise.   Orders Placed This Encounter  Procedures  . Hepatitis A vaccine adult IM  . CBC with Differential/Platelet  . Comprehensive metabolic panel    Order Specific Question:   Has the patient fasted?    Answer:   Yes  . Lipid panel    Order Specific Question:   Has the patient fasted?    Answer:   Yes   Meds ordered this encounter  Medications  . HYDROcodone-acetaminophen (NORCO/VICODIN) 5-325 MG tablet    Sig: Take 1 tablet by mouth 2 (two) times daily as needed for moderate pain or severe pain.    Dispense:  45 tablet    Refill:  0  . traMADol (ULTRAM) 50 MG tablet    Sig: Take 1 tablet (50 mg total) by mouth every 12 (twelve) hours as needed.    Dispense:  60 tablet    Refill:  5  . meloxicam  (MOBIC) 15 MG tablet    Sig: Take 1 tablet (15 mg total) by mouth daily.    Dispense:  90 tablet    Refill:  1    Return in about 6 months (around 04/18/2017) for complete physical examiniation.   Kristi Elayne Guerin, M.D. Primary Care at [facility] previously [facility] [address] [place], [place]  [phone] phone [phone] fax

## 2016-10-16 NOTE — Patient Instructions (Addendum)
IF you received an x-ray today, you will receive an invoice from [facility]. Please contact [facility]iology at [phone] with questions or concerns regarding your invoice.   IF you received labwork today, you will receive an invoice from [facility]. Please contact [facility] at [phone] with questions or concerns regarding your invoice.   Our billing staff will not be able to assist you with questions regarding bills from these companies.  You will be contacted with the lab results as soon as they are available. The fastest way to get your results is to activate your My Chart account. Instructions are located on the last page of this paperwork. If you have not heard from [place] regarding the results in 2 weeks, please contact this office.      Dyslipidemia Dyslipidemia is an imbalance of waxy, fat-like substances (lipids) in the blood. The body needs lipids in small amounts. Dyslipidemia often involves a high level of cholesterol or triglycerides, which are types of lipids. Common forms of dyslipidemia include:  High levels of bad cholesterol (LDL cholesterol). LDL is the type of cholesterol that causes fatty deposits (plaques) to build up in the blood vessels that carry blood away from your heart (arteries).  Low levels of good cholesterol (HDL cholesterol). HDL cholesterol is the type of cholesterol that protects against heart disease. High levels of HDL remove the LDL buildup from arteries.  High levels of triglycerides. Triglycerides are a fatty substance in the blood that is linked to a buildup of plaques in the arteries. You can develop dyslipidemia because of the genes you are born with (primary dyslipidemia) or changes that occur during your life (secondary dyslipidemia), or as a side effect of certain medical treatments. What are the causes? Primary dyslipidemia is caused by changes (mutations) in genes that are passed down through families (inherited). These  mutations cause several types of dyslipidemia. Mutations can result in disorders that make the body produce too much LDL cholesterol or triglycerides, or not enough HDL cholesterol. These disorders may lead to heart disease, arterial disease, or stroke at an early age. Causes of secondary dyslipidemia include certain lifestyle choices and diseases that lead to dyslipidemia, such as:  Eating a diet that is high in animal fat.  Not getting enough activity or exercise (having a sedentary lifestyle).  Having diabetes, kidney disease, liver disease, or thyroid disease.  Drinking large amounts of alcohol.  Using certain types of drugs. What increases the risk? You may be at greater risk for dyslipidemia if you are an older man or if you are a woman who has gone through menopause. Other risk factors include:  Having a family history of dyslipidemia.  Taking certain medicines, including birth control pills, steroids, some diuretics, beta-blockers, and some medicines forHIV.  Smoking cigarettes.  Eating a high-fat diet.  Drinking large amounts of alcohol.  Having certain medical conditions such as diabetes, polycystic ovary syndrome (PCOS), pregnancy, kidney disease, liver disease, or hypothyroidism.  Not exercising regularly.  Being overweight or obese with too much belly fat. What are the signs or symptoms? Dyslipidemia does not usually cause any symptoms. Very high lipid levels can cause fatty bumps under the skin (xanthomas) or a white or gray ring around the black center (pupil) of the eye. Very high triglyceride levels can cause inflammation of the pancreas (pancreatitis). How is this diagnosed? Your health care provider may diagnose dyslipidemia based on a routine blood test (fasting blood test). Because most people do not have symptoms of  the condition, this blood testing (lipid profile) is done on adults age 31 and older and is repeated every 5 years. This test checks:  Total  cholesterol. This is a measure of the total amount of cholesterol in your blood, including LDL cholesterol, HDL cholesterol, and triglycerides. A healthy number is below 200.  LDL cholesterol. The target number for LDL cholesterol is different for each person, depending on individual risk factors. For most people, a number below 100 is healthy. Ask your health care provider what your LDL cholesterol number should be.  HDL cholesterol. An HDL level of 60 or higher is best because it helps to protect against heart disease. A number below 51 for men or below 22 for women increases the risk for heart disease.  Triglycerides. A healthy triglyceride number is below 150. If your lipid profile is abnormal, your health care provider may do other blood tests to get more information about your condition. How is this treated? Treatment depends on the type of dyslipidemia that you have and your other risk factors for heart disease and stroke. Your health care provider will have a target range for your lipid levels based on this information. For many people, treatment starts with lifestyle changes, such as diet and exercise. Your health care provider may recommend that you:  Get regular exercise.  Make changes to your diet.  Quit smoking if you smoke. If diet changes and exercise do not help you reach your goals, your health care provider may also prescribe medicine to lower lipids. The most commonly prescribed type of medicine lowers your LDL cholesterol (statin drug). If you have a high triglyceride level, your provider may prescribe another type of drug (fibrate) or an omega-3 fish oil supplement, or both. Follow these instructions at home:  Take over-the-counter and prescription medicines only as told by your health care provider. This includes supplements.  Get regular exercise. Start an aerobic exercise and strength training program as told by your health care provider. Ask your health care provider  what activities are safe for you. Your health care provider may recommend:  30 minutes of aerobic activity 4-6 days a week. Brisk walking is an example of aerobic activity.  Strength training 2 days a week.  Eat a healthy diet as told by your health care provider. This can help you reach and maintain a healthy weight, lower your LDL cholesterol, and raise your HDL cholesterol. It may help to work with a diet and nutrition specialist (dietitian) to make a plan that is right for you. Your dietitian or health care provider may recommend:  Limiting your calories, if you are overweight.  Eating more fruits, vegetables, whole grains, fish, and lean meats.  Limiting saturated fat, trans fat, and cholesterol.  Follow instructions from your health care provider or dietitian about eating or drinking restrictions.  Limit alcohol intake to no more than one drink per day for nonpregnant women and two drinks per day for men. One drink equals 12 oz of beer, 5 oz of wine, or 1 oz of hard liquor.  Do not use any products that contain nicotine or tobacco, such as cigarettes and e-cigarettes. If you need help quitting, ask your health care provider.  Keep all follow-up visits as told by your health care provider. This is important. Contact a health care provider if:  You are having trouble sticking to your exercise or diet plan.  You are struggling to quit smoking or control your use of alcohol. Summary  Dyslipidemia  is an imbalance of waxy, fat-like substances (lipids) in the blood. The body needs lipids in small amounts. Dyslipidemia often involves a high level of cholesterol or triglycerides, which are types of lipids.  Treatment depends on the type of dyslipidemia that you have and your other risk factors for heart disease and stroke.  For many people, treatment starts with lifestyle changes, such as diet and exercise. Your health care provider may also prescribe medicine to lower lipids. This  information is not intended to replace advice given to you by your health care provider. Make sure you discuss any questions you have with your health care provider. Document Released: 07/28/2013 Document Revised: 03/19/2016 Document Reviewed: 03/19/2016 Elsevier Interactive Patient Education  2017 [employer].

## 2016-10-17 LAB — CBC WITH DIFFERENTIAL/PLATELET
BASOS: 1 %
Basophils Absolute: 0.1 10*3/uL (ref 0.0–0.2)
EOS (ABSOLUTE): 0.3 10*3/uL (ref 0.0–0.4)
EOS: 2 %
HEMATOCRIT: 47.6 % (ref 37.5–51.0)
HEMOGLOBIN: 16.2 g/dL (ref 13.0–17.7)
Immature Grans (Abs): 0 10*3/uL (ref 0.0–0.1)
Immature Granulocytes: 0 %
LYMPHS ABS: 3 10*3/uL (ref 0.7–3.1)
Lymphs: 28 %
MCH: 31.6 pg (ref 26.6–33.0)
MCHC: 34 g/dL (ref 31.5–35.7)
MCV: 93 fL (ref 79–97)
MONOCYTES: 9 %
Monocytes Absolute: 0.9 10*3/uL (ref 0.1–0.9)
NEUTROS ABS: 6.4 10*3/uL (ref 1.4–7.0)
Neutrophils: 60 %
Platelets: 273 10*3/uL (ref 150–379)
RBC: 5.12 x10E6/uL (ref 4.14–5.80)
RDW: 13.1 % (ref 12.3–15.4)
WBC: 10.7 10*3/uL (ref 3.4–10.8)

## 2016-10-17 LAB — LIPID PANEL
CHOLESTEROL TOTAL: 156 mg/dL (ref 100–199)
Chol/HDL Ratio: 3.6 ratio units (ref 0.0–5.0)
HDL: 43 mg/dL (ref >39)
LDL CALC: 73 mg/dL (ref 0–99)
Triglycerides: 201 mg/dL — ABNORMAL HIGH (ref 0–149)
VLDL Cholesterol Cal: 40 mg/dL (ref 5–40)

## 2016-10-17 LAB — COMPREHENSIVE METABOLIC PANEL
A/G RATIO: 1.5 (ref 1.2–2.2)
ALBUMIN: 4.6 g/dL (ref 3.5–5.5)
ALK PHOS: 76 IU/L (ref 39–117)
ALT: 49 IU/L — ABNORMAL HIGH (ref 0–44)
AST: 25 IU/L (ref 0–40)
BILIRUBIN TOTAL: 0.5 mg/dL (ref 0.0–1.2)
BUN / CREAT RATIO: 14 (ref 9–20)
BUN: 14 mg/dL (ref 6–24)
CHLORIDE: 102 mmol/L (ref 96–106)
CO2: 23 mmol/L (ref 18–29)
CREATININE: 1.01 mg/dL (ref 0.76–1.27)
Calcium: 9.8 mg/dL (ref 8.7–10.2)
GFR calc non Af Amer: 89 mL/min/{1.73_m2} (ref >59)
GFR, EST AFRICAN AMERICAN: 103 mL/min/{1.73_m2} (ref >59)
GLOBULIN, TOTAL: 3 g/dL (ref 1.5–4.5)
Glucose: 83 mg/dL (ref 65–99)
Potassium: 4.5 mmol/L (ref 3.5–5.2)
SODIUM: 142 mmol/L (ref 134–144)
Total Protein: 7.6 g/dL (ref 6.0–8.5)

## 2016-10-23 ENCOUNTER — Other Ambulatory Visit: Payer: Self-pay | Admitting: Family Medicine

## 2016-10-24 NOTE — Telephone Encounter (Signed)
He is on several other pain agents.  Please review and refill if appropriate.

## 2016-10-30 DIAGNOSIS — K7581 Nonalcoholic steatohepatitis (NASH): Secondary | ICD-10-CM | POA: Insufficient documentation

## 2017-04-14 ENCOUNTER — Other Ambulatory Visit: Payer: Self-pay | Admitting: Family Medicine

## 2017-04-14 DIAGNOSIS — M5136 Other intervertebral disc degeneration, lumbar region: Secondary | ICD-10-CM

## 2017-04-24 ENCOUNTER — Encounter: Payer: Self-pay | Admitting: Family Medicine

## 2017-04-24 ENCOUNTER — Ambulatory Visit (INDEPENDENT_AMBULATORY_CARE_PROVIDER_SITE_OTHER): Payer: BC Managed Care – PPO | Admitting: Family Medicine

## 2017-04-24 VITALS — BP 129/88 | HR 94 | Temp 98.0°F | Resp 16 | Ht 71.65 in | Wt 269.0 lb

## 2017-04-24 DIAGNOSIS — G8929 Other chronic pain: Secondary | ICD-10-CM

## 2017-04-24 DIAGNOSIS — E7800 Pure hypercholesterolemia, unspecified: Secondary | ICD-10-CM

## 2017-04-24 DIAGNOSIS — Z6841 Body Mass Index (BMI) 40.0 and over, adult: Secondary | ICD-10-CM | POA: Diagnosis not present

## 2017-04-24 DIAGNOSIS — J4520 Mild intermittent asthma, uncomplicated: Secondary | ICD-10-CM

## 2017-04-24 DIAGNOSIS — M5136 Other intervertebral disc degeneration, lumbar region: Secondary | ICD-10-CM

## 2017-04-24 DIAGNOSIS — Z131 Encounter for screening for diabetes mellitus: Secondary | ICD-10-CM | POA: Diagnosis not present

## 2017-04-24 DIAGNOSIS — M545 Low back pain: Secondary | ICD-10-CM | POA: Diagnosis not present

## 2017-04-24 DIAGNOSIS — Z0000 Encounter for general adult medical examination without abnormal findings: Secondary | ICD-10-CM

## 2017-04-24 DIAGNOSIS — Z125 Encounter for screening for malignant neoplasm of prostate: Secondary | ICD-10-CM | POA: Diagnosis not present

## 2017-04-24 DIAGNOSIS — K7581 Nonalcoholic steatohepatitis (NASH): Secondary | ICD-10-CM

## 2017-04-24 DIAGNOSIS — Z23 Encounter for immunization: Secondary | ICD-10-CM | POA: Diagnosis not present

## 2017-04-24 MED ORDER — CYCLOBENZAPRINE HCL 5 MG PO TABS: 5.0000 mg | ORAL_TABLET | Freq: Three times a day (TID) | ORAL | 0 refills | Status: DC | PRN

## 2017-04-24 MED ORDER — TRAMADOL HCL 50 MG PO TABS: 50.0000 mg | ORAL_TABLET | Freq: Two times a day (BID) | ORAL | 5 refills | Status: DC | PRN

## 2017-04-24 MED ORDER — HYDROCODONE-ACETAMINOPHEN 5-325 MG PO TABS: 1.0000 | ORAL_TABLET | Freq: Two times a day (BID) | ORAL | 0 refills | Status: DC | PRN

## 2017-04-24 MED ORDER — ATORVASTATIN CALCIUM 10 MG PO TABS: 10.0000 mg | ORAL_TABLET | Freq: Every day | ORAL | 1 refills | Status: DC

## 2017-04-24 NOTE — Progress Notes (Signed)
Subjective:    Patient ID: Nathan Holder, male    DOB: June 09, 1971, 46 y.o.   MRN: [id number]  04/24/2017  Annual Exam   HPI This 46 y.o. male presents for Complete Physical examination.  Last physical:  04-03-2016 PSA: Eye exam:  Last year. Dental exam: several years ago.   Visual Acuity Screening   Right eye Left eye Both eyes  Without correction: 20/20 20/40 20/20   With correction:       BP Readings from Last 3 Encounters:  04/24/17 129/88  10/16/16 126/83  07/28/16 130/88   Wt Readings from Last 3 Encounters:  04/24/17 269 lb (122 kg)  10/16/16 296 lb (134.3 kg)  07/28/16 (!) 309 lb (140.2 kg)   Immunization History  Administered Date(s) Administered  . Hepatitis A, Adult 04/03/2016, 10/16/2016  . Hepatitis B 08/06/2002  . Influenza Split 08/06/2001, 06/07/2015  . Influenza,inj,Quad PF,6+ Mos 06/01/2013, 04/03/2016, 04/24/2017  . Influenza-Unspecified 05/06/2014  . Td 08/06/2002  . Tdap 05/03/2014    Review of Systems  Constitutional: Negative for activity change, appetite change, chills, diaphoresis, fatigue, fever and unexpected weight change.  HENT: Negative for congestion, dental problem, drooling, ear discharge, ear pain, facial swelling, hearing loss, mouth sores, nosebleeds, postnasal drip, rhinorrhea, sinus pressure, sneezing, sore throat, tinnitus, trouble swallowing and voice change.   Eyes: Negative for photophobia, pain, discharge, redness, itching and visual disturbance.  Respiratory: Negative for apnea, cough, choking, chest tightness, shortness of breath, wheezing and stridor.   Cardiovascular: Negative for chest pain, palpitations and leg swelling.  Gastrointestinal: Negative for abdominal pain, blood in stool, constipation, diarrhea, nausea and vomiting.  Endocrine: Negative for cold intolerance, heat intolerance, polydipsia, polyphagia and polyuria.  Genitourinary: Negative for decreased urine volume, difficulty urinating, discharge,  dysuria, enuresis, flank pain, frequency, genital sores, hematuria, penile pain, penile swelling, scrotal swelling, testicular pain and urgency.       Nocturia x 0-1.  Urinary stream is strong.  Musculoskeletal: Negative for arthralgias, back pain, gait problem, joint swelling, myalgias, neck pain and neck stiffness.  Skin: Negative for color change, pallor, rash and wound.  Allergic/Immunologic: Negative for environmental allergies, food allergies and immunocompromised state.  Neurological: Negative for dizziness, tremors, seizures, syncope, facial asymmetry, speech difficulty, weakness, light-headedness, numbness and headaches.  Hematological: Negative for adenopathy. Does not bruise/bleed easily.  Psychiatric/Behavioral: Negative for agitation, behavioral problems, confusion, decreased concentration, dysphoric mood, hallucinations, self-injury, sleep disturbance and suicidal ideas. The patient is not nervous/anxious and is not hyperactive.        Bedtime 9-930; wakes up 5-530.    Past Medical History:  Diagnosis Date  . Allergy    mold pollen  . Asthma    very mild  . Back pain   . Hyperlipidemia   . NASH (nonalcoholic steatohepatitis)    abdominal US revealed   Past Surgical History:  Procedure Laterality Date  . APPENDECTOMY    . BACK SURGERY    . SPINE SURGERY  08/06/2008   diskectomy L4-5 2010, 2004.   No Known Allergies  Social History   Social History  . Marital status: Married    Spouse name: N/A  . Number of children: 2  . Years of education: N/A   Occupational History  . teacher     Pre-K teacher   Social History Main Topics  . Smoking status: Former Research scientist (life sciences)  . Smokeless tobacco: Never Used  . Alcohol use 6.0 oz/week    10 Standard drinks or equivalent per week  Comment: drink daily-2 drinks, per Health Survey (05/03/14)  10-16 drinks  . Drug use: No  . Sexual activity: Yes    Partners: Female    Birth control/ protection: None     Comment: number of  sex partners in the last 34 months 1   Other Topics Concern  . Not on file   Social History Narrative   Marital status: married x 20 years.      Children: 2 children (9 yo daughter, 7 yo son)      Lives: with wife, 2 children, 2 cats.      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      Tobacco:  None       Alcohol:  1-2 drinks per day; was drinking 20 drinks per week in 2016.      Exercise: none in 2018      Seatbelt: 100%; no texting.      Guns:  Guns lots of them; concealed carrier permit; loaded but secured.     Family History  Problem Relation Age of Onset  . Adopted: Yes       Objective:    BP 129/88   Pulse 94   Temp 98 F (36.7 C) (Oral)   Resp 16   Ht 5' 11.65" (1.82 m)   Wt 269 lb (122 kg)   SpO2 96%   BMI 36.84 kg/m  Physical Exam  Constitutional: He is oriented to person, place, and time. He appears well-developed and well-nourished. No distress.  HENT:  Head: Normocephalic and atraumatic.  Right Ear: External ear normal.  Left Ear: External ear normal.  Nose: Nose normal.  Mouth/Throat: Oropharynx is clear and moist.  Eyes: Pupils are equal, round, and reactive to light. Conjunctivae and EOM are normal.  Neck: Normal range of motion. Neck supple. Carotid bruit is not present. No thyromegaly present.  Cardiovascular: Normal rate, regular rhythm, normal heart sounds and intact distal pulses.  Exam reveals no gallop and no friction rub.   No murmur heard. Pulmonary/Chest: Effort normal and breath sounds normal. He has no wheezes. He has no rales.  Abdominal: Soft. Bowel sounds are normal. He exhibits no distension and no mass. There is no tenderness. There is no rebound and no guarding. Hernia confirmed negative in the right inguinal area and confirmed negative in the left inguinal area.  Genitourinary: Testes normal and penis normal.  Musculoskeletal:       Right shoulder: Normal.       Left shoulder: Normal.        Cervical back: Normal.  Lymphadenopathy:    He has no cervical adenopathy.       Right: No inguinal adenopathy present.       Left: No inguinal adenopathy present.  Neurological: He is alert and oriented to person, place, and time. He has normal reflexes. No cranial nerve deficit. He exhibits normal muscle tone. Coordination normal.  Skin: Skin is warm and dry. No rash noted. He is not diaphoretic.  Psychiatric: He has a normal mood and affect. His behavior is normal. Judgment and thought content normal.    No results found. Depression screen [facility] 2/9 04/24/2017 10/16/2016 07/28/2016 06/29/2016 04/03/2016  Decreased Interest 0 0 0 0 0  Down, Depressed, Hopeless 0 0 - 0 0  PHQ - 2 Score 0 0 0 0 0   Fall Risk  04/24/2017 10/16/2016 07/28/2016 06/29/2016 04/03/2016  Falls in the past year? No No No No  No  Number falls in past yr: - - - - -        Assessment & Plan:   1. Routine physical examination   2. Need for prophylactic vaccination and inoculation against influenza   3. Pure hypercholesterolemia   4. Screening for diabetes mellitus   5. Screening for malignant neoplasm of prostate   6. NASH (nonalcoholic steatohepatitis)   7. Degenerative disc disease, lumbar   8. Chronic midline low back pain without sciatica   9. Class 3 severe obesity due to excess calories with serious comorbidity and body mass index (BMI) of 40.0 to 44.9 in adult ([place])   10. Asthma, chronic, mild intermittent, uncomplicated   11. Chronic bilateral low back pain without sciatica    -anticipatory guidance provided --- exercise, weight loss, safe driving practices, aspirin 68m daily. -obtain age appropriate screening labs and labs for chronic disease management. -congratulations on ongoing weight loss; lower back pain chronic is much improved.   Orders Placed This Encounter  Procedures  . Flu Vaccine QUAD 36+ mos IM  . CBC with Differential/Platelet  . Comprehensive metabolic panel    Order Specific  Question:   Has the patient fasted?    Answer:   Yes  . Hemoglobin A1c  . Lipid panel    Order Specific Question:   Has the patient fasted?    Answer:   Yes  . PSA  . TSH  . Urinalysis, Routine w reflex microscopic   Meds ordered this encounter  Medications  . atorvastatin (LIPITOR) 10 MG tablet    Sig: Take 1 tablet (10 mg total) by mouth daily.    Dispense:  90 tablet    Refill:  1  . cyclobenzaprine (FLEXERIL) 5 MG tablet    Sig: Take 1 tablet (5 mg total) by mouth 3 (three) times daily as needed for muscle spasms.    Dispense:  270 tablet    Refill:  0  . traMADol (ULTRAM) 50 MG tablet    Sig: Take 1 tablet (50 mg total) by mouth every 12 (twelve) hours as needed.    Dispense:  60 tablet    Refill:  5  . HYDROcodone-acetaminophen (NORCO/VICODIN) 5-325 MG tablet    Sig: Take 1 tablet by mouth 2 (two) times daily as needed for moderate pain or severe pain.    Dispense:  45 tablet    Refill:  0    Return in about 6 months (around 10/22/2017) for recheck cholesterol, chronic back pain.   Jarquis Walker MElayne Guerin M.D. [facility] [place] [place]  [phone] ([phone]

## 2017-04-24 NOTE — Patient Instructions (Addendum)
   IF you received an x-ray today, you will receive an invoice from [facility]. Please contact [facility] at [phone] with questions or concerns regarding your invoice.   IF you received labwork today, you will receive an invoice from [facility]. Please contact [facility] at [phone] with questions or concerns regarding your invoice.   Our billing staff will not be able to assist you with questions regarding bills from these companies.  You will be contacted with the lab results as soon as they are available. The fastest way to get your results is to activate your My Chart account. Instructions are located on the last page of this paperwork. If you have not heard from us regarding the results in 2 weeks, please contact this office.      Preventive Care 40-64 Years, Male Preventive care refers to lifestyle choices and visits with your health care provider that can promote health and wellness. What does preventive care include?  A yearly physical exam. This is also called an annual well check.  Dental exams once or twice a year.  Routine eye exams. Ask your health care provider how often you should have your eyes checked.  Personal lifestyle choices, including: ? Daily care of your teeth and gums. ? Regular physical activity. ? Eating a healthy diet. ? Avoiding tobacco and drug use. ? Limiting alcohol use. ? Practicing safe sex. ? Taking low-dose aspirin every day starting at age 50. What happens during an annual well check? The services and screenings done by your health care provider during your annual well check will depend on your age, overall health, lifestyle risk factors, and family history of disease. Counseling Your health care provider may ask you questions about your:  Alcohol use.  Tobacco use.  Drug use.  Emotional well-being.  Home and relationship well-being.  Sexual activity.  Eating habits.  Work and work  environment.  Screening You may have the following tests or measurements:  Height, weight, and BMI.  Blood pressure.  Lipid and cholesterol levels. These may be checked every 5 years, or more frequently if you are over 50 years old.  Skin check.  Lung cancer screening. You may have this screening every year starting at age 55 if you have a 30-pack-year history of smoking and currently smoke or have quit within the past 15 years.  Fecal occult blood test (FOBT) of the stool. You may have this test every year starting at age 50.  Flexible sigmoidoscopy or colonoscopy. You may have a sigmoidoscopy every 5 years or a colonoscopy every 10 years starting at age 50.  Prostate cancer screening. Recommendations will vary depending on your family history and other risks.  Hepatitis C blood test.  Hepatitis B blood test.  Sexually transmitted disease (STD) testing.  Diabetes screening. This is done by checking your blood sugar (glucose) after you have not eaten for a while (fasting). You may have this done every 1-3 years.  Discuss your test results, treatment options, and if necessary, the need for more tests with your health care provider. Vaccines Your health care provider may recommend certain vaccines, such as:  Influenza vaccine. This is recommended every year.  Tetanus, diphtheria, and acellular pertussis (Tdap, Td) vaccine. You may need a Td booster every 10 years.  Varicella vaccine. You may need this if you have not been vaccinated.  Zoster vaccine. You may need this after age 60.  Measles, mumps, and rubella (MMR) vaccine. You may need at least one dose   of MMR if you were born in 1957 or later. You may also need a second dose.  Pneumococcal 13-valent conjugate (PCV13) vaccine. You may need this if you have certain conditions and have not been vaccinated.  Pneumococcal polysaccharide (PPSV23) vaccine. You may need one or two doses if you smoke cigarettes or if you have  certain conditions.  Meningococcal vaccine. You may need this if you have certain conditions.  Hepatitis A vaccine. You may need this if you have certain conditions or if you travel or work in places where you may be exposed to hepatitis A.  Hepatitis B vaccine. You may need this if you have certain conditions or if you travel or work in places where you may be exposed to hepatitis B.  Haemophilus influenzae type b (Hib) vaccine. You may need this if you have certain risk factors.  Talk to your health care provider about which screenings and vaccines you need and how often you need them. This information is not intended to replace advice given to you by your health care provider. Make sure you discuss any questions you have with your health care provider. Document Released: 08/19/2015 Document Revised: 04/11/2016 Document Reviewed: 05/24/2015 Elsevier Interactive Patient Education  2017 Elsevier Inc.  

## 2017-04-29 ENCOUNTER — Telehealth: Payer: Self-pay | Admitting: *Deleted

## 2017-04-30 ENCOUNTER — Ambulatory Visit (INDEPENDENT_AMBULATORY_CARE_PROVIDER_SITE_OTHER): Payer: BC Managed Care – PPO | Admitting: Family Medicine

## 2017-04-30 DIAGNOSIS — Z0000 Encounter for general adult medical examination without abnormal findings: Secondary | ICD-10-CM

## 2017-05-01 LAB — CBC WITH DIFFERENTIAL/PLATELET
BASOS ABS: 0.1 10*3/uL (ref 0.0–0.2)
Basos: 1 %
EOS (ABSOLUTE): 0.1 10*3/uL (ref 0.0–0.4)
Eos: 1 %
HEMOGLOBIN: 16 g/dL (ref 13.0–17.7)
Hematocrit: 46.9 % (ref 37.5–51.0)
IMMATURE GRANS (ABS): 0 10*3/uL (ref 0.0–0.1)
Immature Granulocytes: 0 %
LYMPHS: 19 %
Lymphocytes Absolute: 1.7 10*3/uL (ref 0.7–3.1)
MCH: 33 pg (ref 26.6–33.0)
MCHC: 34.1 g/dL (ref 31.5–35.7)
MCV: 97 fL (ref 79–97)
MONOCYTES: 8 %
Monocytes Absolute: 0.7 10*3/uL (ref 0.1–0.9)
NEUTROS ABS: 6.4 10*3/uL (ref 1.4–7.0)
Neutrophils: 71 %
Platelets: 281 10*3/uL (ref 150–379)
RBC: 4.85 x10E6/uL (ref 4.14–5.80)
RDW: 13.4 % (ref 12.3–15.4)
WBC: 9 10*3/uL (ref 3.4–10.8)

## 2017-05-01 LAB — URINALYSIS, ROUTINE W REFLEX MICROSCOPIC
Bilirubin, UA: NEGATIVE
GLUCOSE, UA: NEGATIVE
Leukocytes, UA: NEGATIVE
Nitrite, UA: NEGATIVE
Protein, UA: NEGATIVE
RBC, UA: NEGATIVE
Specific Gravity, UA: >=1.03 — AB (ref 1.005–1.030)
Urobilinogen, Ur: 0.2 mg/dL (ref 0.2–1.0)
pH, UA: 5 (ref 5.0–7.5)

## 2017-05-01 LAB — COMPREHENSIVE METABOLIC PANEL
ALBUMIN: 4.6 g/dL (ref 3.5–5.5)
ALT: 48 IU/L — ABNORMAL HIGH (ref 0–44)
AST: 27 IU/L (ref 0–40)
Albumin/Globulin Ratio: 1.7 (ref 1.2–2.2)
Alkaline Phosphatase: 64 IU/L (ref 39–117)
BUN / CREAT RATIO: 14 (ref 9–20)
BUN: 13 mg/dL (ref 6–24)
Bilirubin Total: 0.7 mg/dL (ref 0.0–1.2)
CO2: 24 mmol/L (ref 20–29)
CREATININE: 0.91 mg/dL (ref 0.76–1.27)
Calcium: 9.9 mg/dL (ref 8.7–10.2)
Chloride: 103 mmol/L (ref 96–106)
GFR calc non Af Amer: 101 mL/min/{1.73_m2} (ref >59)
GFR, EST AFRICAN AMERICAN: 117 mL/min/{1.73_m2} (ref >59)
GLUCOSE: 90 mg/dL (ref 65–99)
Globulin, Total: 2.7 g/dL (ref 1.5–4.5)
Potassium: 4.8 mmol/L (ref 3.5–5.2)
Sodium: 143 mmol/L (ref 134–144)
TOTAL PROTEIN: 7.3 g/dL (ref 6.0–8.5)

## 2017-05-01 LAB — PSA: Prostate Specific Ag, Serum: 1.2 ng/mL (ref 0.0–4.0)

## 2017-05-01 LAB — LIPID PANEL
CHOL/HDL RATIO: 2.4 ratio (ref 0.0–5.0)
Cholesterol, Total: 120 mg/dL (ref 100–199)
HDL: 50 mg/dL (ref >39)
LDL CALC: 57 mg/dL (ref 0–99)
TRIGLYCERIDES: 67 mg/dL (ref 0–149)
VLDL Cholesterol Cal: 13 mg/dL (ref 5–40)

## 2017-05-01 LAB — HEMOGLOBIN A1C
Est. average glucose Bld gHb Est-mCnc: 108 mg/dL
HEMOGLOBIN A1C: 5.4 % (ref 4.8–5.6)

## 2017-05-01 LAB — TSH: TSH: 2.11 u[IU]/mL (ref 0.450–4.500)

## 2017-05-03 NOTE — Progress Notes (Signed)
Lab visit only.

## 2017-05-15 ENCOUNTER — Telehealth: Payer: Self-pay | Admitting: Family Medicine

## 2017-05-15 NOTE — Telephone Encounter (Signed)
Mother called for emergency advice --- patient with very bizarre behavior.  Very concerned regarding personality changes.Two weeks ago, seemed fine.  Now has left wife of 14 years, two children; did not show up for work.  Drinking excessively. New sratches on car.  He implied that he could hurt himself or others.  Adopted at 56 old. No knowledge of birth family. He is resisting to come in.  He shacked up with some male.  Nicest guy ever.  Family is beside himself.  He and his wife have a cruise in two weeks.  To try to get him to an appointment is impossible;  Needs a note from a physician to cancel the cruise.  Plans on going to a cruise alone.  Worried about hurting himself or someone.  Mother lives in [place].  Very scared.  Mother reports that she loves his son; previous counseling due to binges as a teenager.  Then would come out of it on his own; odd behaviors; uncontrollable behaviors.  Mother is worried about bipolar disorder. He eventually pop out of bizarre behaviors.  Would call [facility]ie behaviors.  Counselors could never get a good hold on it.  Aunt is child counselor and suggested bipolar; no definitive dx; normal when not manic.  Wife knows that mother is calling; wife asked mother to call; family is unified.  Mother is Violet [phone]; Gregary Signs is totally in the loop.  Mother is a Automotive engineer.

## 2017-07-06 ENCOUNTER — Encounter: Payer: Self-pay | Admitting: Physician Assistant

## 2017-07-06 ENCOUNTER — Ambulatory Visit: Payer: BC Managed Care – PPO | Admitting: Physician Assistant

## 2017-07-06 VITALS — BP 138/90 | HR 88 | Temp 98.7°F | Resp 18 | Ht 71.5 in | Wt 266.0 lb

## 2017-07-06 DIAGNOSIS — J029 Acute pharyngitis, unspecified: Secondary | ICD-10-CM | POA: Diagnosis not present

## 2017-07-06 DIAGNOSIS — J209 Acute bronchitis, unspecified: Secondary | ICD-10-CM | POA: Diagnosis not present

## 2017-07-06 LAB — POCT RAPID STREP A (OFFICE): RAPID STREP A SCREEN: NEGATIVE

## 2017-07-06 MED ORDER — GUAIFENESIN ER 1200 MG PO TB12: 1.0000 | ORAL_TABLET | Freq: Two times a day (BID) | ORAL | 1 refills | Status: DC | PRN

## 2017-07-06 MED ORDER — BENZONATATE 100 MG PO CAPS: 100.0000 mg | ORAL_CAPSULE | Freq: Three times a day (TID) | ORAL | 0 refills | Status: DC | PRN

## 2017-07-06 MED ORDER — AZELASTINE HCL 0.1 % NA SOLN: 2.0000 | Freq: Two times a day (BID) | NASAL | 0 refills | Status: DC

## 2017-07-06 MED ORDER — AZITHROMYCIN 250 MG PO TABS: ORAL_TABLET | ORAL | 0 refills | Status: DC

## 2017-07-06 NOTE — Patient Instructions (Addendum)
   IF you received an x-ray today, you will receive an invoice from [facility]. Please contact [facility] at [phone] with questions or concerns regarding your invoice.   IF you received labwork today, you will receive an invoice from [facility]. Please contact [facility] at [phone] with questions or concerns regarding your invoice.   Our billing staff will not be able to assist you with questions regarding bills from these companies.  You will be contacted with the lab results as soon as they are available. The fastest way to get your results is to activate your My Chart account. Instructions are located on the last page of this paperwork. If you have not heard from us regarding the results in 2 weeks, please contact this office.      Acute Bronchitis, Adult Acute bronchitis is sudden (acute) swelling of the air tubes (bronchi) in the lungs. Acute bronchitis causes these tubes to fill with mucus, which can make it hard to breathe. It can also cause coughing or wheezing. In adults, acute bronchitis usually goes away within 2 weeks. A cough caused by bronchitis may last up to 3 weeks. Smoking, allergies, and asthma can make the condition worse. Repeated episodes of bronchitis may cause further lung problems, such as chronic obstructive pulmonary disease (COPD). What are the causes? This condition can be caused by germs and by substances that irritate the lungs, including:  Cold and flu viruses. This condition is most often caused by the same virus that causes a cold.  Bacteria.  Exposure to tobacco smoke, dust, fumes, and air pollution.  What increases the risk? This condition is more likely to develop in people who:  Have close contact with someone with acute bronchitis.  Are exposed to lung irritants, such as tobacco smoke, dust, fumes, and vapors.  Have a weak immune system.  Have a respiratory condition such as asthma.  What are the signs or  symptoms? Symptoms of this condition include:  A cough.  Coughing up clear, yellow, or green mucus.  Wheezing.  Chest congestion.  Shortness of breath.  A fever.  Body aches.  Chills.  A sore throat.  How is this diagnosed? This condition is usually diagnosed with a physical exam. During the exam, your health care provider may order tests, such as chest X-rays, to rule out other conditions. He or she may also:  Test a sample of your mucus for bacterial infection.  Check the level of oxygen in your blood. This is done to check for pneumonia.  Do a chest X-ray or lung function testing to rule out pneumonia and other conditions.  Perform blood tests.  Your health care provider will also ask about your symptoms and medical history. How is this treated? Most cases of acute bronchitis clear up over time without treatment. Your health care provider may recommend:  Drinking more fluids. Drinking more makes your mucus thinner, which may make it easier to breathe.  Taking a medicine for a fever or cough.  Taking an antibiotic medicine.  Using an inhaler to help improve shortness of breath and to control a cough.  Using a cool mist vaporizer or humidifier to make it easier to breathe.  Follow these instructions at home: Medicines  Take over-the-counter and prescription medicines only as told by your health care provider.  If you were prescribed an antibiotic, take it as told by your health care provider. Do not stop taking the antibiotic even if you start to feel better. General instructions    Get plenty of rest.  Drink enough fluids to keep your urine clear or pale yellow.  Avoid smoking and secondhand smoke. Exposure to cigarette smoke or irritating chemicals will make bronchitis worse. If you smoke and you need help quitting, ask your health care provider. Quitting smoking will help your lungs heal faster.  Use an inhaler, cool mist vaporizer, or humidifier as told  by your health care provider.  Keep all follow-up visits as told by your health care provider. This is important. How is this prevented? To lower your risk of getting this condition again:  Wash your hands often with soap and water. If soap and water are not available, use hand sanitizer.  Avoid contact with people who have cold symptoms.  Try not to touch your hands to your mouth, nose, or eyes.  Make sure to get the flu shot every year.  Contact a health care provider if:  Your symptoms do not improve in 2 weeks of treatment. Get help right away if:  You cough up blood.  You have chest pain.  You have severe shortness of breath.  You become dehydrated.  You faint or keep feeling like you are going to faint.  You keep vomiting.  You have a severe headache.  Your fever or chills gets worse. This information is not intended to replace advice given to you by your health care provider. Make sure you discuss any questions you have with your health care provider. Document Released: 08/30/2004 Document Revised: 02/15/2016 Document Reviewed: 01/11/2016 Elsevier Interactive Patient Education  2017 Elsevier Inc.  

## 2017-07-06 NOTE — Progress Notes (Signed)
Patient ID: Nathan Holder, male    DOB: 25-May-1971, 46 y.o.   MRN: [id number]  PCP: [name], MD  Chief Complaint  Patient presents with  . Cough    Productive, persistent  . Sore Throat    Tender    Subjective:   Presents for evaluation of cough and sore throat.  Cough for weeks. Productive of light colored sputum. Former smoker. Feels pressure and tightness in the chest. Sinus congestion is mild. Not much post nasal drainage. Head pressure, not pain. Sore throat, severe, and new this week. Laryngitis intermittently. No fever, chills, nausea, vomiting or diarrhea. Loss of appetite.  He works as a Freight forwarder.   Review of Systems As above.    Patient Active Problem List   Diagnosis Date Noted  . NASH (nonalcoholic steatohepatitis) 10/30/2016  . Degenerative disc disease, lumbar 05/30/2015  . Obesity 04/05/2014  . Pure hypercholesterolemia 04/05/2014  . Chronic lower back pain 04/05/2014     Prior to Admission medications   Medication Sig Start Date End Date Taking? Authorizing Provider  albuterol (VENTOLIN HFA) 108 (90 Base) MCG/ACT inhaler Inhale 2 puffs into the lungs every 6 (six) hours as needed for wheezing. 04/03/16  Yes [name], MD  atorvastatin (LIPITOR) 10 MG tablet Take 1 tablet (10 mg total) by mouth daily. 04/24/17  Yes [name], MD  cyclobenzaprine (FLEXERIL) 5 MG tablet Take 1 tablet (5 mg total) by mouth 3 (three) times daily as needed for muscle spasms. 04/24/17  Yes [name], MD  fish oil-omega-3 fatty acids 1000 MG capsule Take 2 g by mouth daily.   Yes [provider]  Flaxseed, Linseed, (FLAX SEEDS PO) Take by mouth.   Yes [provider]  meloxicam (MOBIC) 15 MG tablet TAKE 1 TABLET (15 MG TOTAL) BY MOUTH DAILY. 04/15/17  Yes [name], MD  nystatin-triamcinolone ointment [facility]) Apply 1 application topically 2 (two) times daily. 04/03/16  Yes [name], MD  OVER THE COUNTER  MEDICATION Vitamin B6 taken twice a day   Yes [provider]  Phenylephrine-DM-GG-APAP (TYLENOL COLD/FLU SEVERE PO) Take by mouth.   Yes [provider]  traMADol (ULTRAM) 50 MG tablet Take 1 tablet (50 mg total) by mouth every 12 (twelve) hours as needed. 04/24/17  Yes [name], MD  HYDROcodone-acetaminophen (NORCO/VICODIN) 5-325 MG tablet Take 1 tablet by mouth 2 (two) times daily as needed for moderate pain or severe pain. Patient not taking: Reported on 07/06/2017 04/24/17   [name], MD     No Known Allergies     Objective:  Physical Exam  Constitutional: He is oriented to person, place, and time. He appears well-developed and well-nourished. No distress.  BP 138/90   Pulse 88   Temp 98.7 F (37.1 C) (Oral)   Resp 18   Ht 5' 11.5" (1.816 m)   Wt 266 lb (120.7 kg)   SpO2 96%   BMI 36.58 kg/m    HENT:  Head: Normocephalic and atraumatic.  Right Ear: Hearing, tympanic membrane, external ear and ear canal normal.  Left Ear: Hearing, tympanic membrane, external ear and ear canal normal.  Nose: Mucosal edema and rhinorrhea present.  No foreign bodies. Right sinus exhibits no maxillary sinus tenderness and no frontal sinus tenderness. Left sinus exhibits no maxillary sinus tenderness and no frontal sinus tenderness.  Mouth/Throat: Uvula is midline and mucous membranes are normal. No uvula swelling. Posterior oropharyngeal erythema present.  No oropharyngeal exudate, posterior oropharyngeal edema or tonsillar abscesses.  Eyes: Conjunctivae and EOM are normal. Pupils are equal, round, and reactive to light. Right eye exhibits no discharge. Left eye exhibits no discharge. No scleral icterus.  Neck: Trachea normal, normal range of motion and full passive range of motion without pain. Neck supple. No thyroid mass and no thyromegaly present.  Cardiovascular: Normal rate, regular rhythm and normal heart sounds.  Pulmonary/Chest: Effort normal and breath sounds  normal.  Lymphadenopathy:       Head (right side): No submandibular, no tonsillar, no preauricular, no posterior auricular and no occipital adenopathy present.       Head (left side): No submandibular, no tonsillar, no preauricular and no occipital adenopathy present.    He has no cervical adenopathy.       Right: No supraclavicular adenopathy present.       Left: No supraclavicular adenopathy present.  Neurological: He is alert and oriented to person, place, and time. He has normal strength. No cranial nerve deficit or sensory deficit.  Skin: Skin is warm, dry and intact. No rash noted.  Psychiatric: He has a normal mood and affect. His speech is normal and behavior is normal.    Results for orders placed or performed in visit on 07/06/17  POCT rapid strep A  Result Value Ref Range   Rapid Strep A Screen Negative Negative       Assessment & Plan:   1. Sore throat Likely due to drainage and cough. Await TCx. Azithromycin will likely address infection if present. - POCT rapid strep A - Culture, Group A Strep  2. Acute bronchitis, unspecified organism Given duration of cough, elect to cover for bacterial cause in addition to supportive care. - azithromycin (ZITHROMAX) 250 MG tablet; Take 2 tabs PO x 1 dose, then 1 tab PO QD x 4 days  Dispense: 6 tablet; Refill: 0 - azelastine (ASTELIN) 0.1 % nasal spray; Place 2 sprays into both nostrils 2 (two) times daily. Use in each nostril as directed  Dispense: 30 mL; Refill: 0 - benzonatate (TESSALON) 100 MG capsule; Take 1-2 capsules (100-200 mg total) by mouth 3 (three) times daily as needed for cough.  Dispense: 40 capsule; Refill: 0 - Guaifenesin (MUCINEX MAXIMUM STRENGTH) 1200 MG TB12; Take 1 tablet (1,200 mg total) by mouth every 12 (twelve) hours as needed.  Dispense: 14 tablet; Refill: 1    Return if symptoms worsen or fail to improve.   Fara Chute, PA-C [facility] [place]

## 2017-07-09 LAB — CULTURE, GROUP A STREP: STREP A CULTURE: NEGATIVE

## 2017-07-31 ENCOUNTER — Telehealth: Payer: Self-pay | Admitting: Physician Assistant

## 2017-07-31 DIAGNOSIS — J209 Acute bronchitis, unspecified: Secondary | ICD-10-CM

## 2017-07-31 MED ORDER — AZELASTINE HCL 0.1 % NA SOLN: 2.0000 | Freq: Two times a day (BID) | NASAL | 3 refills | Status: DC

## 2017-07-31 NOTE — Telephone Encounter (Signed)
Fax from pharmacy requesting 90-day supply of azelastine.  Meds ordered this encounter  Medications  . azelastine (ASTELIN) 0.1 % nasal spray    Sig: Place 2 sprays into both nostrils 2 (two) times daily. Use in each nostril as directed    Dispense:  90 mL    Refill:  3    Order Specific Question:   Supervising Provider    Answer:   Brigitte Pulse, EVA N [[id number]]

## 2017-08-07 ENCOUNTER — Other Ambulatory Visit: Payer: Self-pay

## 2017-08-07 ENCOUNTER — Encounter: Payer: Self-pay | Admitting: Physician Assistant

## 2017-08-07 ENCOUNTER — Ambulatory Visit: Payer: BC Managed Care – PPO | Admitting: Physician Assistant

## 2017-08-07 VITALS — BP 126/90 | HR 113 | Temp 100.7°F | Resp 18 | Ht 71.5 in | Wt 266.8 lb

## 2017-08-07 DIAGNOSIS — J029 Acute pharyngitis, unspecified: Secondary | ICD-10-CM

## 2017-08-07 DIAGNOSIS — R6889 Other general symptoms and signs: Secondary | ICD-10-CM

## 2017-08-07 LAB — POC INFLUENZA A&B (BINAX/QUICKVUE)
Influenza A, POC: NEGATIVE
Influenza B, POC: NEGATIVE

## 2017-08-07 LAB — POCT RAPID STREP A (OFFICE): RAPID STREP A SCREEN: NEGATIVE

## 2017-08-07 MED ORDER — BENZONATATE 100 MG PO CAPS: 100.0000 mg | ORAL_CAPSULE | Freq: Three times a day (TID) | ORAL | 0 refills | Status: DC | PRN

## 2017-08-07 MED ORDER — GUAIFENESIN ER 1200 MG PO TB12: 1.0000 | ORAL_TABLET | Freq: Two times a day (BID) | ORAL | 1 refills | Status: DC | PRN

## 2017-08-07 MED ORDER — AMOXICILLIN 875 MG PO TABS: 875.0000 mg | ORAL_TABLET | Freq: Two times a day (BID) | ORAL | 0 refills | Status: DC

## 2017-08-07 NOTE — Patient Instructions (Signed)
     IF you received an x-ray today, you will receive an invoice from [facility]. Please contact [facility] at [phone] with questions or concerns regarding your invoice.   IF you received labwork today, you will receive an invoice from [facility]. Please contact [facility] at [phone] with questions or concerns regarding your invoice.   Our billing staff will not be able to assist you with questions regarding bills from these companies.  You will be contacted with the lab results as soon as they are available. The fastest way to get your results is to activate your My Chart account. Instructions are located on the last page of this paperwork. If you have not heard from us regarding the results in 2 weeks, please contact this office.     

## 2017-08-07 NOTE — Progress Notes (Signed)
Patient ID: Nathan Holder, male    DOB: 1971-06-16, 47 y.o.   MRN: [id number]  PCP: [name], MD  Chief Complaint  Patient presents with  . Sore Throat    x3 days, pt states he has sore throat and body aches and headches. Pt has had a fever since yesterday. Pt has been taking OTC daytime Tylenol Sinus.    Subjective:   Presents for evaluation of sore throat x 3 days.  Feels like a gradual onset over the 3 days of symptoms. Started with sore throat, which remains the worst of his symptoms. Fever Tmax 101, WITH acetaminophen. Cough is non-productive. Intermittent nasal/sinus congestion. Post-nasal drainage. Feels achy. Headache, which is atypical for him.  No nausea, vomiting or diarrhea. No dizziness. No weakness. No stiff neck, back pain. No photophobia.  I saw him 07/06/2017 foe cough x weeks and a sore throat. He was treated for atypicals with azithromycin. Symptoms completely resolved x 2 weeks prior to onset of current symptoms.  Review of Systems As above    Patient Active Problem List   Diagnosis Date Noted  . NASH (nonalcoholic steatohepatitis) 10/30/2016  . Degenerative disc disease, lumbar 05/30/2015  . Obesity 04/05/2014  . Pure hypercholesterolemia 04/05/2014  . Chronic lower back pain 04/05/2014     Prior to Admission medications   Medication Sig Start Date End Date Taking? Authorizing Provider  albuterol (VENTOLIN HFA) 108 (90 Base) MCG/ACT inhaler Inhale 2 puffs into the lungs every 6 (six) hours as needed for wheezing. 04/03/16  Yes [name], MD  atorvastatin (LIPITOR) 10 MG tablet Take 1 tablet (10 mg total) by mouth daily. 04/24/17  Yes [name], MD  azelastine (ASTELIN) 0.1 % nasal spray Place 2 sprays into both nostrils 2 (two) times daily. Use in each nostril as directed 07/31/17  Yes Janalee Grobe, PA-C  cyclobenzaprine (FLEXERIL) 5 MG tablet Take 1 tablet (5 mg total) by mouth 3 (three) times daily as needed for  muscle spasms. 04/24/17  Yes [name], MD  fish oil-omega-3 fatty acids 1000 MG capsule Take 2 g by mouth daily.   Yes [provider]  Flaxseed, Linseed, (FLAX SEEDS PO) Take by mouth.   Yes [provider]  meloxicam (MOBIC) 15 MG tablet TAKE 1 TABLET (15 MG TOTAL) BY MOUTH DAILY. 04/15/17  Yes [name], MD  nystatin-triamcinolone ointment [facility]) Apply 1 application topically 2 (two) times daily. 04/03/16  Yes [name], MD  OVER THE COUNTER MEDICATION Vitamin B6 taken twice a day   Yes [provider]  Phenylephrine-DM-GG-APAP (TYLENOL COLD/FLU SEVERE PO) Take by mouth.   Yes [provider]  traMADol (ULTRAM) 50 MG tablet Take 1 tablet (50 mg total) by mouth every 12 (twelve) hours as needed. 04/24/17  Yes [name], MD     No Known Allergies     Objective:  Physical Exam  Constitutional: He is oriented to person, place, and time. He appears well-developed and well-nourished. No distress.  BP 126/90 (BP Location: Left Arm, Patient Position: Sitting, Cuff Size: Large)   Pulse (!) 113   Temp (!) 100.7 F (38.2 C) (Oral)   Resp 18   Ht 5' 11.5" (1.816 m)   Wt 266 lb 12.8 oz (121 kg)   SpO2 96%   BMI 36.69 kg/m    HENT:  Head: Normocephalic and atraumatic.  Right Ear: Hearing, tympanic membrane, external ear and ear canal normal.  Left  Ear: Hearing, tympanic membrane, external ear and ear canal normal.  Nose: Mucosal edema and rhinorrhea present.  No foreign bodies. Right sinus exhibits no maxillary sinus tenderness and no frontal sinus tenderness. Left sinus exhibits no maxillary sinus tenderness and no frontal sinus tenderness.  Mouth/Throat: Uvula is midline and mucous membranes are normal. No uvula swelling. Oropharyngeal exudate, posterior oropharyngeal edema and posterior oropharyngeal erythema present. No tonsillar abscesses.  Eyes: Conjunctivae and EOM are normal. Pupils are equal, round, and reactive to light.  Right eye exhibits no discharge. Left eye exhibits no discharge. No scleral icterus.  Neck: Trachea normal, normal range of motion and full passive range of motion without pain. Neck supple. No thyroid mass and no thyromegaly present.  Cardiovascular: Normal rate, regular rhythm and normal heart sounds.  Pulmonary/Chest: Effort normal and breath sounds normal.  Lymphadenopathy:       Head (right side): No submandibular, no tonsillar, no preauricular, no posterior auricular and no occipital adenopathy present.       Head (left side): No submandibular, no tonsillar, no preauricular and no occipital adenopathy present.    He has no cervical adenopathy.       Right: No supraclavicular adenopathy present.       Left: No supraclavicular adenopathy present.  Neurological: He is alert and oriented to person, place, and time. He has normal strength. No cranial nerve deficit or sensory deficit.  Skin: Skin is warm, dry and intact. No rash noted.  Psychiatric: He has a normal mood and affect. His speech is normal and behavior is normal.    Results for orders placed or performed in visit on 08/07/17  POCT rapid strep A  Result Value Ref Range   Rapid Strep A Screen Negative Negative  POC Influenza A&B(BINAX/QUICKVUE)  Result Value Ref Range   Influenza A, POC Negative Negative   Influenza B, POC Negative Negative       Assessment & Plan:   1. Flu-like symptoms Negative influenza test. Given gradual onset, suspect NOT flu, but that diagnosis remains possible. Rest, hydrate. - POC Influenza A&B(BINAX/QUICKVUE) - benzonatate (TESSALON) 100 MG capsule; Take 1-2 capsules (100-200 mg total) by mouth 3 (three) times daily as needed for cough.  Dispense: 40 capsule; Refill: 0 - Guaifenesin (MUCINEX MAXIMUM STRENGTH) 1200 MG TB12; Take 1 tablet (1,200 mg total) by mouth every 12 (twelve) hours as needed.  Dispense: 14 tablet; Refill: 1  2. Sore throat Exudative tonsillitis. Cover for suspected strep  pharyngitis with amoxicillin pending throat culture. - POCT rapid strep A - Culture, Group A Strep - amoxicillin (AMOXIL) 875 MG tablet; Take 1 tablet (875 mg total) by mouth 2 (two) times daily.  Dispense: 20 tablet; Refill: 0    Return if symptoms worsen or fail to improve.   Fara Chute, PA-C [facility] [place]

## 2017-08-09 LAB — CULTURE, GROUP A STREP

## 2017-08-25 ENCOUNTER — Other Ambulatory Visit: Payer: Self-pay | Admitting: Family Medicine

## 2017-08-26 NOTE — Telephone Encounter (Signed)
Last OV 04/24/17.  Don't see where this Mobic is addressed.   Pt of Dr. Tamala Julian

## 2017-09-30 ENCOUNTER — Encounter: Payer: Self-pay | Admitting: Family Medicine

## 2017-09-30 DIAGNOSIS — Z630 Problems in relationship with spouse or partner: Secondary | ICD-10-CM

## 2017-10-23 ENCOUNTER — Encounter: Payer: Self-pay | Admitting: Family Medicine

## 2017-10-23 ENCOUNTER — Other Ambulatory Visit: Payer: Self-pay

## 2017-10-23 ENCOUNTER — Ambulatory Visit: Payer: BC Managed Care – PPO | Admitting: Family Medicine

## 2017-10-23 VITALS — BP 110/80 | HR 94 | Temp 98.5°F | Ht 74.0 in | Wt 265.4 lb

## 2017-10-23 DIAGNOSIS — E66813 Obesity, class 3: Secondary | ICD-10-CM

## 2017-10-23 DIAGNOSIS — M5136 Other intervertebral disc degeneration, lumbar region: Secondary | ICD-10-CM | POA: Diagnosis not present

## 2017-10-23 DIAGNOSIS — Z630 Problems in relationship with spouse or partner: Secondary | ICD-10-CM | POA: Diagnosis not present

## 2017-10-23 DIAGNOSIS — G8929 Other chronic pain: Secondary | ICD-10-CM

## 2017-10-23 DIAGNOSIS — Z6841 Body Mass Index (BMI) 40.0 and over, adult: Secondary | ICD-10-CM | POA: Diagnosis not present

## 2017-10-23 DIAGNOSIS — E7800 Pure hypercholesterolemia, unspecified: Secondary | ICD-10-CM | POA: Diagnosis not present

## 2017-10-23 DIAGNOSIS — M51369 Other intervertebral disc degeneration, lumbar region without mention of lumbar back pain or lower extremity pain: Secondary | ICD-10-CM

## 2017-10-23 DIAGNOSIS — M545 Low back pain, unspecified: Secondary | ICD-10-CM

## 2017-10-23 DIAGNOSIS — R6889 Other general symptoms and signs: Secondary | ICD-10-CM

## 2017-10-23 MED ORDER — TRAMADOL HCL 50 MG PO TABS: 50.0000 mg | ORAL_TABLET | Freq: Two times a day (BID) | ORAL | 5 refills | Status: DC | PRN

## 2017-10-23 MED ORDER — BENZONATATE 100 MG PO CAPS: 100.0000 mg | ORAL_CAPSULE | Freq: Three times a day (TID) | ORAL | 0 refills | Status: DC | PRN

## 2017-10-23 MED ORDER — TRAMADOL HCL 50 MG PO TABS: 50.0000 mg | ORAL_TABLET | Freq: Two times a day (BID) | ORAL | 3 refills | Status: DC | PRN

## 2017-10-23 MED ORDER — HYDROCODONE-ACETAMINOPHEN 5-325 MG PO TABS: 1.0000 | ORAL_TABLET | Freq: Four times a day (QID) | ORAL | 0 refills | Status: DC | PRN

## 2017-10-23 MED ORDER — MELOXICAM 15 MG PO TABS: 15.0000 mg | ORAL_TABLET | Freq: Every day | ORAL | 0 refills | Status: DC

## 2017-10-23 NOTE — Patient Instructions (Signed)
     IF you received an x-ray today, you will receive an invoice from [facility]. Please contact [facility] at [phone] with questions or concerns regarding your invoice.   IF you received labwork today, you will receive an invoice from [facility]. Please contact [facility] at [phone] with questions or concerns regarding your invoice.   Our billing staff will not be able to assist you with questions regarding bills from these companies.  You will be contacted with the lab results as soon as they are available. The fastest way to get your results is to activate your My Chart account. Instructions are located on the last page of this paperwork. If you have not heard from us regarding the results in 2 weeks, please contact this office.     

## 2017-10-23 NOTE — Progress Notes (Signed)
Subjective:    Patient ID: Nathan Holder, male    DOB: 1970-11-10, 48 y.o.   MRN: [id number]  10/23/2017  Chronic Conditions (6 month F/U)    HPI This 47 y.o. male presents for six month follow-up of hypercholesterolemia, DDD lumbar spine.  No changes to management made at last visit.   Lower back sprain stable; two acute exacerbations; recovery time is better yet still has issues; moving a lot of things/boxes while moving lately.    B feet pain: wearing inserts all the time.  Doing well for the past six months.  This weekend, jumped into [place] shoes; thought broke foot; hobbled around for three days.  Mild swelling.   Along R heel.  Pain along longitudinal arch; elevated and iced.  Now back to minimal pain.  More kids coming to sick lately; parents sending kids to school; sick in first week December, January,February; now has mild infection currently; thought had food poisoning over the weekend.  No drinking this weekend. Low grade fever with diarrhea and vomiting.    Out of house for five months. Moved in with someone with male significant friend. Expiration date on marriage a long time.  Had to constantly remind wife that pt did not work for her.  Into music and shows together; wife had no interest in going to shows.  Lots of belittling. Roommate for three years. [place] turns 34 in June; son turns 15 soon.  With moving out, thougth children would understand. Basile/son age 38 yo. Procrastinating.  Worse now. Now drinking less. Sleep is poor.  Mia is girlfriend; dropping off and picking up.  Goes to bed at 9:00pm; picking her up at 10:30.  Wants to stay up.   Appointment on November 23, 2017. Financial strain.     BP Readings from Last 3 Encounters:  10/23/17 110/80  08/07/17 126/90  07/06/17 138/90   Wt Readings from Last 3 Encounters:  10/23/17 265 lb 6.4 oz (120.4 kg)  08/07/17 266 lb 12.8 oz (121 kg)  07/06/17 266 lb (120.7 kg)   Immunization History    Administered Date(s) Administered  . Hepatitis A, Adult 04/03/2016, 10/16/2016  . Hepatitis B 08/06/2002  . Influenza Split 08/06/2001, 06/07/2015  . Influenza,inj,Quad PF,6+ Mos 06/01/2013, 04/03/2016, 04/24/2017  . Influenza-Unspecified 05/06/2014  . Td 08/06/2002  . Tdap 05/03/2014    Review of Systems  Constitutional: Negative for activity change, appetite change, chills, diaphoresis, fatigue and fever.  Respiratory: Negative for cough and shortness of breath.   Cardiovascular: Negative for chest pain, palpitations and leg swelling.  Gastrointestinal: Negative for abdominal pain, constipation, diarrhea, nausea and vomiting.  Endocrine: Negative for cold intolerance, heat intolerance, polydipsia, polyphagia and polyuria.  Musculoskeletal: Positive for arthralgias, back pain and gait problem.  Skin: Negative for color change, rash and wound.  Neurological: Negative for dizziness, tremors, seizures, syncope, facial asymmetry, speech difficulty, weakness, light-headedness, numbness and headaches.  Psychiatric/Behavioral: Positive for dysphoric mood. Negative for sleep disturbance. The patient is nervous/anxious.     Past Medical History:  Diagnosis Date  . Allergy    mold pollen  . Asthma    very mild  . Back pain   . Hyperlipidemia   . NASH (nonalcoholic steatohepatitis)    abdominal US revealed   Past Surgical History:  Procedure Laterality Date  . APPENDECTOMY    . BACK SURGERY    . SPINE SURGERY  08/06/2008   diskectomy L4-5 2010, 2004.   No Known Allergies Current Outpatient Medications on  File Prior to Visit  Medication Sig Dispense Refill  . albuterol (VENTOLIN HFA) 108 (90 Base) MCG/ACT inhaler Inhale 2 puffs into the lungs every 6 (six) hours as needed for wheezing. 1 Inhaler 2  . atorvastatin (LIPITOR) 10 MG tablet Take 1 tablet (10 mg total) by mouth daily. 90 tablet 1  . azelastine (ASTELIN) 0.1 % nasal spray Place 2 sprays into both nostrils 2 (two) times  daily. Use in each nostril as directed 90 mL 3  . fish oil-omega-3 fatty acids 1000 MG capsule Take 2 g by mouth daily.    . Flaxseed, Linseed, (FLAX SEEDS PO) Take by mouth.    . nystatin-triamcinolone ointment (MYCOLOG) Apply 1 application topically 2 (two) times daily. 30 g 3  . OVER THE COUNTER MEDICATION Vitamin B6 taken twice a day    . amoxicillin (AMOXIL) 875 MG tablet Take 1 tablet (875 mg total) by mouth 2 (two) times daily. (Patient not taking: Reported on 10/23/2017) 20 tablet 0   No current facility-administered medications on file prior to visit.    Social History   Socioeconomic History  . Marital status: Married    Spouse name: Not on file  . Number of children: 2  . Years of education: Not on file  . Highest education level: Not on file  Occupational History  . Occupation: Pharmacist, hospital    Comment: Pre-K teacher  Social Needs  . Financial resource strain: Not on file  . Food insecurity:    Worry: Not on file    Inability: Not on file  . Transportation needs:    Medical: Not on file    Non-medical: Not on file  Tobacco Use  . Smoking status: Former Research scientist (life sciences)  . Smokeless tobacco: Never Used  Substance and Sexual Activity  . Alcohol use: Yes    Alcohol/week: 6.0 oz    Types: 10 Standard drinks or equivalent per week    Comment: drink daily-2 drinks, per Health Survey (05/03/14)  10-16 drinks  . Drug use: No  . Sexual activity: Yes    Partners: Female    Birth control/protection: None    Comment: number of sex partners in the last 12 months 1  Lifestyle  . Physical activity:    Days per week: Not on file    Minutes per session: Not on file  . Stress: Not on file  Relationships  . Social connections:    Talks on phone: Not on file    Gets together: Not on file    Attends religious service: Not on file    Active member of club or organization: Not on file    Attends meetings of clubs or organizations: Not on file    Relationship status: Not on file  . Intimate  partner violence:    Fear of current or ex partner: Not on file    Emotionally abused: Not on file    Physically abused: Not on file    Forced sexual activity: Not on file  Other Topics Concern  . Not on file  Social History Narrative   Marital status: married x 20 years.      Children: 2 children (49 yo daughter, 66 yo son)      Lives: with wife, 2 children, 2 cats.      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      Tobacco:  None       Alcohol:  1-2 drinks per day; was drinking  20 drinks per week in 2016.      Exercise: none in 2018      Seatbelt: 100%; no texting.      Guns:  Guns lots of them; concealed carrier permit; loaded but secured.     Family History  Adopted: Yes       Objective:    BP 110/80 (BP Location: Left Arm, Patient Position: Sitting, Cuff Size: Normal)   Pulse 94   Temp 98.5 F (36.9 C) (Oral)   Ht 6' 2"  (1.88 m)   Wt 265 lb 6.4 oz (120.4 kg)   SpO2 98%   BMI 34.08 kg/m  Physical Exam  Constitutional: He is oriented to person, place, and time. He appears well-developed and well-nourished. No distress.  HENT:  Head: Normocephalic and atraumatic.  Right Ear: External ear normal.  Left Ear: External ear normal.  Nose: Nose normal.  Mouth/Throat: Oropharynx is clear and moist.  Eyes: Conjunctivae and EOM are normal. Pupils are equal, round, and reactive to light.  Neck: Normal range of motion. Neck supple. Carotid bruit is not present. No thyromegaly present.  Cardiovascular: Normal rate, regular rhythm, normal heart sounds and intact distal pulses. Exam reveals no gallop and no friction rub.  No murmur heard. Pulmonary/Chest: Effort normal and breath sounds normal. He has no wheezes. He has no rales.  Abdominal: Soft. Bowel sounds are normal. He exhibits no distension and no mass. There is no tenderness. There is no rebound and no guarding.  Musculoskeletal:       Lumbar back: He exhibits pain. He exhibits  normal range of motion, no tenderness, no bony tenderness, no spasm and normal pulse.  Lymphadenopathy:    He has no cervical adenopathy.  Neurological: He is alert and oriented to person, place, and time. No cranial nerve deficit. He exhibits normal muscle tone. Coordination normal.  Skin: Skin is warm and dry. No rash noted. He is not diaphoretic.  Psychiatric: He has a normal mood and affect. His behavior is normal. Judgment and thought content normal.  Nursing note and vitals reviewed.  No results found. Depression screen [facility] 2/9 10/23/2017 07/06/2017 04/24/2017 10/16/2016 07/28/2016  Decreased Interest 0 0 0 0 0  Down, Depressed, Hopeless 0 0 0 0 -  PHQ - 2 Score 0 0 0 0 0   Fall Risk  10/23/2017 07/06/2017 04/24/2017 10/16/2016 07/28/2016  Falls in the past year? No No No No No  Number falls in past yr: - - - - -        Assessment & Plan:   1. Pure hypercholesterolemia   2. Degenerative disc disease, lumbar   3. Chronic midline low back pain without sciatica   4. Flu-like symptoms   5. Class 3 severe obesity due to excess calories with serious comorbidity and body mass index (BMI) of 40.0 to 44.9 in adult ([place])   6. Marital problems     Stable chronic medical conditions including hypercholesterolemia, DDD lumbar spine, obesity; obtain labs for chronic disease management; refills provided.  Marital strain: New; counseling provided during visit; highly encourage counseling; names and referral provided to patient; coping fairly well at this time.  URI: improving; refill of Tessalon Perles provided.  Obesity: Recommend weight loss, exercise for 30-60 minutes five days per week; recommend 1800 kcal restriction per day with a minimum of 60 grams of protein per day.  Eat 3 meals per day. Do not skip meals. Consider having a protein shake as a meal replacement to aid with  eliminating meal skipping. Look for products with <220 calories, <7 gm sugar, and 20-30 gm protein.  Eat breakfast  within 2 hours of getting up.   Make  your plate non-starchy vegetables,  protein, and  carbohydrates at lunch and dinner.   Aim for at least 64 oz. of calorie-free beverages daily (water, Crystal Light, diet green tea, etc.). Eliminate any sugary beverages such as regular soda, sweet tea, or fruit juice.   Pay attention to hunger and fullness cues.  Stop eating once you feel satisfied; don't wait until you feel full, stuffed, or sick from eating.  Choose lean meats and low fat/fat free dairy products.  Choose foods high in fiber such as fruits, vegetables, and whole grains (brown rice, whole wheat pasta, whole wheat bread, etc.).  Limit foods with added sugar to <7 gm per serving.  Always eat in the kitchen/dining room.  Never eat in the bedroom or in front of the TV.   -prolonged face-to-face for 40 minutes with greater than 50% of time dedicated to counseling and coordination of care.  Orders Placed This Encounter  Procedures  . Lipid panel    Order Specific Question:   Has the patient fasted?    Answer:   No  . CBC with Differential/Platelet  . Comprehensive metabolic panel    Order Specific Question:   Has the patient fasted?    Answer:   No   Meds ordered this encounter  Medications  . benzonatate (TESSALON) 100 MG capsule    Sig: Take 1-2 capsules (100-200 mg total) by mouth 3 (three) times daily as needed for cough.    Dispense:  40 capsule    Refill:  0  . meloxicam (MOBIC) 15 MG tablet    Sig: Take 1 tablet (15 mg total) by mouth daily.    Dispense:  90 tablet    Refill:  0  . HYDROcodone-acetaminophen (NORCO/VICODIN) 5-325 MG tablet    Sig: Take 1 tablet by mouth every 6 (six) hours as needed for moderate pain.    Dispense:  30 tablet    Refill:  0  . DISCONTD: traMADol (ULTRAM) 50 MG tablet    Sig: Take 1 tablet (50 mg total) by mouth every 12 (twelve) hours as needed.    Dispense:  60 tablet    Refill:  5  . traMADol (ULTRAM) 50 MG tablet    Sig: Take 1  tablet (50 mg total) by mouth every 12 (twelve) hours as needed.    Dispense:  60 tablet    Refill:  3    Return in about 3 months (around 01/23/2018) for follow-up chronic medical conditions.   Michiah Masse Elayne Guerin, M.D. Primary Care at [facility] previously [facility] [address] [place], [place]  [address] [phone] phone [phone] fax

## 2017-10-24 LAB — COMPREHENSIVE METABOLIC PANEL WITH GFR
ALT: 26 IU/L (ref 0–44)
AST: 23 IU/L (ref 0–40)
Albumin/Globulin Ratio: 1.7 (ref 1.2–2.2)
Albumin: 4.4 g/dL (ref 3.5–5.5)
Alkaline Phosphatase: 66 IU/L (ref 39–117)
BUN/Creatinine Ratio: 18 (ref 9–20)
BUN: 14 mg/dL (ref 6–24)
Bilirubin Total: 0.6 mg/dL (ref 0.0–1.2)
CO2: 24 mmol/L (ref 20–29)
Calcium: 9.6 mg/dL (ref 8.7–10.2)
Chloride: 103 mmol/L (ref 96–106)
Creatinine, Ser: 0.79 mg/dL (ref 0.76–1.27)
GFR calc Af Amer: 124 mL/min/1.73 (ref >59)
GFR calc non Af Amer: 108 mL/min/1.73 (ref >59)
Globulin, Total: 2.6 g/dL (ref 1.5–4.5)
Glucose: 89 mg/dL (ref 65–99)
Potassium: 5.2 mmol/L (ref 3.5–5.2)
Sodium: 140 mmol/L (ref 134–144)
Total Protein: 7 g/dL (ref 6.0–8.5)

## 2017-10-24 LAB — LIPID PANEL
Chol/HDL Ratio: 3.3 ratio (ref 0.0–5.0)
Cholesterol, Total: 116 mg/dL (ref 100–199)
HDL: 35 mg/dL — ABNORMAL LOW (ref >39)
LDL Calculated: 61 mg/dL (ref 0–99)
Triglycerides: 100 mg/dL (ref 0–149)
VLDL Cholesterol Cal: 20 mg/dL (ref 5–40)

## 2017-10-24 LAB — CBC WITH DIFFERENTIAL/PLATELET
Basophils Absolute: 0.1 10*3/uL (ref 0.0–0.2)
Basos: 1 %
EOS (ABSOLUTE): 0.2 10*3/uL (ref 0.0–0.4)
EOS: 4 %
HEMATOCRIT: 48.4 % (ref 37.5–51.0)
Hemoglobin: 16.3 g/dL (ref 13.0–17.7)
Immature Grans (Abs): 0 10*3/uL (ref 0.0–0.1)
Immature Granulocytes: 0 %
LYMPHS ABS: 1.7 10*3/uL (ref 0.7–3.1)
Lymphs: 25 %
MCH: 32 pg (ref 26.6–33.0)
MCHC: 33.7 g/dL (ref 31.5–35.7)
MCV: 95 fL (ref 79–97)
MONOS ABS: 1 10*3/uL — AB (ref 0.1–0.9)
Monocytes: 15 %
Neutrophils Absolute: 3.8 10*3/uL (ref 1.4–7.0)
Neutrophils: 55 %
Platelets: 234 10*3/uL (ref 150–379)
RBC: 5.09 x10E6/uL (ref 4.14–5.80)
RDW: 12.8 % (ref 12.3–15.4)
WBC: 6.9 10*3/uL (ref 3.4–10.8)

## 2017-10-28 ENCOUNTER — Other Ambulatory Visit: Payer: Self-pay | Admitting: Family Medicine

## 2017-11-18 ENCOUNTER — Other Ambulatory Visit: Payer: Self-pay | Admitting: Family Medicine

## 2017-11-18 DIAGNOSIS — M5136 Other intervertebral disc degeneration, lumbar region: Secondary | ICD-10-CM

## 2017-11-18 DIAGNOSIS — M545 Low back pain: Secondary | ICD-10-CM

## 2017-11-18 DIAGNOSIS — G8929 Other chronic pain: Secondary | ICD-10-CM

## 2017-11-18 NOTE — Telephone Encounter (Signed)
Refill  Request  Ul[place]am  LOV 10/23/2017   Pharmacy on File

## 2017-11-25 ENCOUNTER — Ambulatory Visit: Payer: BC Managed Care – PPO | Admitting: Psychology

## 2017-11-25 DIAGNOSIS — F4321 Adjustment disorder with depressed mood: Secondary | ICD-10-CM

## 2017-12-26 ENCOUNTER — Encounter: Payer: Self-pay | Admitting: Family Medicine

## 2017-12-29 ENCOUNTER — Other Ambulatory Visit: Payer: Self-pay | Admitting: Family Medicine

## 2017-12-29 DIAGNOSIS — E7800 Pure hypercholesterolemia, unspecified: Secondary | ICD-10-CM

## 2018-01-13 ENCOUNTER — Ambulatory Visit: Payer: BC Managed Care – PPO | Admitting: Psychology

## 2018-01-28 NOTE — Progress Notes (Signed)
Subjective:    Patient ID: Nathan Holder, male    DOB: 1971/05/01, 47 y.o.   MRN: [id number]  02/03/2018  Medical Management of Chronic Issues (and discuss who he should go to now )    HPI This 47 y.o. male presents for follow-up evaluation of acute stress reaction.  Management changes made at last visit include the following:  Stable chronic medical conditions including hypercholesterolemia, DDD lumbar spine, obesity; obtain labs for chronic disease management; refills provided. Marital strain: New; counseling provided during visit; highly encourage counseling; names and referral provided to patient; coping fairly well at this time. URI: improving; refill of Tessalon Perles provided.   UPDATE: Had girlfriend move out. Wife hates patient. Willadean Carol is up in October. Decent relationship with children to the face. Did not celebrate Father's Day because he cheated on mother. [place] turned 27 in June 2019.  Same job for 14 years.  Son is very hateful. Wife is hateful and really sad. Living with Mia did not work upt.   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   No SI/HI. Rosario Adie, PA saw son; really liked.   Hypercholesterolemia: Patient reports good compliance with medication, good tolerance to medication, and good symptom control.    Chronic lower back pain: stable at this time; using medications as prescribed.  No worsening issues.  Denies numbness/tingling/weakness.     BP Readings from Last 3 Encounters:  02/03/18 128/76  10/23/17 110/80  08/07/17 126/90   Wt Readings from Last 3 Encounters:  02/03/18 271 lb 12.8 oz (123.3 kg)  10/23/17 265 lb 6.4 oz (120.4 kg)  08/07/17 266 lb 12.8 oz (121 kg)   Immunization History  Administered Date(s) Administered  . Hepatitis A, Adult 04/03/2016, 10/16/2016  . Hepatitis B 08/06/2002  . Influenza Split 08/06/2001, 06/07/2015  . Influenza,inj,Quad PF,6+ Mos 06/01/2013, 04/03/2016, 04/24/2017  . Influenza-Unspecified  05/06/2014  . Td 08/06/2002  . Tdap 05/03/2014    Review of Systems  Constitutional: Negative for activity change, appetite change, chills, diaphoresis, fatigue and fever.  Respiratory: Negative for cough and shortness of breath.   Cardiovascular: Negative for chest pain, palpitations and leg swelling.  Gastrointestinal: Negative for abdominal pain, diarrhea, nausea and vomiting.  Endocrine: Negative for cold intolerance, heat intolerance, polydipsia, polyphagia and polyuria.  Musculoskeletal: Positive for back pain and myalgias.  Skin: Negative for color change, rash and wound.  Neurological: Negative for dizziness, tremors, seizures, syncope, facial asymmetry, speech difficulty, weakness, light-headedness, numbness and headaches.  Psychiatric/Behavioral: Positive for dysphoric mood. Negative for sleep disturbance. The patient is not nervous/anxious.     Past Medical History:  Diagnosis Date  . Allergy    mold pollen  . Asthma    very mild  . Back pain   . Hyperlipidemia   . NASH (nonalcoholic steatohepatitis)    abdominal US revealed   Past Surgical History:  Procedure Laterality Date  . APPENDECTOMY    . BACK SURGERY    . SPINE SURGERY  08/06/2008   diskectomy L4-5 2010, 2004.   No Known Allergies Current Outpatient Medications on File Prior to Visit  Medication Sig Dispense Refill  . albuterol (VENTOLIN HFA) 108 (90 Base) MCG/ACT inhaler Inhale 2 puffs into the lungs every 6 (six) hours as needed for wheezing. 1 Inhaler 2   No current facility-administered medications on file prior to visit.    Social History   Socioeconomic History  . Marital status: Married    Spouse name: Not on file  .  Number of children: 2  . Years of education: Not on file  . Highest education level: Not on file  Occupational History  . Occupation: Pharmacist, hospital    Comment: Pre-K teacher  Social Needs  . Financial resource strain: Not on file  . Food insecurity:    Worry: Not on file     Inability: Not on file  . Transportation needs:    Medical: Not on file    Non-medical: Not on file  Tobacco Use  . Smoking status: Former Research scientist (life sciences)  . Smokeless tobacco: Never Used  Substance and Sexual Activity  . Alcohol use: Yes    Alcohol/week: 6.0 oz    Types: 10 Standard drinks or equivalent per week    Comment: drink daily-2 drinks, per Health Survey (05/03/14)  10-16 drinks  . Drug use: No  . Sexual activity: Yes    Partners: Female    Birth control/protection: None    Comment: number of sex partners in the last 12 months 1  Lifestyle  . Physical activity:    Days per week: Not on file    Minutes per session: Not on file  . Stress: Not on file  Relationships  . Social connections:    Talks on phone: Not on file    Gets together: Not on file    Attends religious service: Not on file    Active member of club or organization: Not on file    Attends meetings of clubs or organizations: Not on file    Relationship status: Not on file  . Intimate partner violence:    Fear of current or ex partner: Not on file    Emotionally abused: Not on file    Physically abused: Not on file    Forced sexual activity: Not on file  Other Topics Concern  . Not on file  Social History Narrative   Marital status: married x 20 years.      Children: 2 children (95 yo daughter, 14 yo son)      Lives: with wife, 2 children, 2 cats.      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      Tobacco:  None       Alcohol:  1-2 drinks per day; was drinking 20 drinks per week in 2016.      Exercise: none in 2018      Seatbelt: 100%; no texting.      Guns:  Guns lots of them; concealed carrier permit; loaded but secured.     Family History  Adopted: Yes       Objective:    BP 128/76   Pulse 96   Temp 99.4 F (37.4 C) (Oral)   Resp 18   Ht 6' 2"  (1.88 m)   Wt 271 lb 12.8 oz (123.3 kg)   SpO2 96%   BMI 34.90 kg/m  Physical Exam  Constitutional: He is  oriented to person, place, and time. He appears well-developed and well-nourished. No distress.  HENT:  Head: Normocephalic and atraumatic.  Right Ear: External ear normal.  Left Ear: External ear normal.  Nose: Nose normal.  Mouth/Throat: Oropharynx is clear and moist.  Eyes: Pupils are equal, round, and reactive to light. Conjunctivae and EOM are normal.  Neck: Normal range of motion. Neck supple. Carotid bruit is not present. No thyromegaly present.  Cardiovascular: Normal rate, regular rhythm, normal heart sounds and intact distal pulses. Exam reveals no gallop and no friction  rub.  No murmur heard. Pulmonary/Chest: Effort normal and breath sounds normal. He has no wheezes. He has no rales.  Lymphadenopathy:    He has no cervical adenopathy.  Neurological: He is alert and oriented to person, place, and time. He displays normal reflexes. No cranial nerve deficit or sensory deficit. He exhibits normal muscle tone. Coordination normal.  Skin: Skin is warm and dry. No rash noted. He is not diaphoretic.  Psychiatric: He has a normal mood and affect. His behavior is normal. Judgment normal.  Nursing note and vitals reviewed.  No results found. Depression screen [facility] 2/9 02/03/2018 10/23/2017 07/06/2017 04/24/2017 10/16/2016  Decreased Interest 0 0 0 0 0  Down, Depressed, Hopeless 0 0 0 0 0  PHQ - 2 Score 0 0 0 0 0   Fall Risk  02/03/2018 10/23/2017 07/06/2017 04/24/2017 10/16/2016  Falls in the past year? No No No No No  Number falls in past yr: - - - - -        Assessment & Plan:   1. Pure hypercholesterolemia   2. Chronic midline low back pain without sciatica   3. Degenerative disc disease, lumbar   4. Counseling for marital and partner problems   5. NASH (nonalcoholic steatohepatitis)   6. Class 1 obesity due to excess calories with serious comorbidity and body mass index (BMI) of 34.0 to 34.9 in adult     Hypercholesterolemia well-controlled on current regimen.  No changes to therapy  at this time.  Degenerative disc disease of lumbar spine chronic back pain: Stable on current regimen.  Continue current medications as prescribed.  Counseling for marital and family problems: Counseling provided during visit.  Status post 1 single psychotherapy session.  No longer dating girlfriend.  Attempting to improve relationship with children and wife.  Coping well currently.  Encourage exercise for stress and anxiety management.  [place] obesity: Recommend weight loss, exercise for 30-60 minutes five days per week; recommend 1800 kcal restriction per day with a minimum of 60 grams of protein per day.  Eat 3 meals per day. Do not skip meals. Consider having a protein shake as a meal replacement to aid with eliminating meal skipping. Look for products with <220 calories, <7 gm sugar, and 20-30 gm protein.  Eat breakfast within 2 hours of getting up.   Make  your plate non-starchy vegetables,  protein, and  carbohydrates at lunch and dinner.   Aim for at least 64 oz. of calorie-free beverages daily (water, Crystal Light, diet green tea, etc.). Eliminate any sugary beverages such as regular soda, sweet tea, or fruit juice.   Pay attention to hunger and fullness cues.  Stop eating once you feel satisfied; don't wait until you feel full, stuffed, or sick from eating.  Choose lean meats and low fat/fat free dairy products.  Choose foods high in fiber such as fruits, vegetables, and whole grains (brown rice, whole wheat pasta, whole wheat bread, etc.).  Limit foods with added sugar to <7 gm per serving.  Always eat in the kitchen/dining room.  Never eat in the bedroom or in front of the TV.     No orders of the defined types were placed in this encounter.  Meds ordered this encounter  Medications  . atorvastatin (LIPITOR) 10 MG tablet    Sig: Take 1 tablet (10 mg total) by mouth daily.    Dispense:  90 tablet    Refill:  1  . cyclobenzaprine (FLEXERIL) 5 MG tablet  Sig: TAKE 1  TABLET BY MOUTH THREE TIMES A DAY AS NEEDED FOR MUSCLE SPASMS    Dispense:  90 tablet    Refill:  1  . meloxicam (MOBIC) 15 MG tablet    Sig: Take 1 tablet (15 mg total) by mouth daily as needed for pain.    Dispense:  90 tablet    Refill:  1  . traMADol (ULTRAM) 50 MG tablet    Sig: Take 1 tablet (50 mg total) by mouth every 12 (twelve) hours as needed.    Dispense:  60 tablet    Refill:  3    Return in about 3 months (around 05/06/2018) for follow-up chronic medical conditions MIKE MANI, PAC.   Shayne Diguglielmo Elayne Guerin, M.D. Primary Care at [facility] previously [facility] [address] [place], [place]  [address] [phone] phone [phone] fax

## 2018-02-03 ENCOUNTER — Encounter: Payer: Self-pay | Admitting: Family Medicine

## 2018-02-03 ENCOUNTER — Ambulatory Visit: Payer: BC Managed Care – PPO | Admitting: Family Medicine

## 2018-02-03 ENCOUNTER — Other Ambulatory Visit: Payer: Self-pay

## 2018-02-03 VITALS — BP 128/76 | HR 96 | Temp 99.4°F | Resp 18 | Ht 74.0 in | Wt 271.8 lb

## 2018-02-03 DIAGNOSIS — E6609 Other obesity due to excess calories: Secondary | ICD-10-CM | POA: Diagnosis not present

## 2018-02-03 DIAGNOSIS — M545 Low back pain, unspecified: Secondary | ICD-10-CM

## 2018-02-03 DIAGNOSIS — M5136 Other intervertebral disc degeneration, lumbar region: Secondary | ICD-10-CM | POA: Diagnosis not present

## 2018-02-03 DIAGNOSIS — Z630 Problems in relationship with spouse or partner: Secondary | ICD-10-CM

## 2018-02-03 DIAGNOSIS — E7800 Pure hypercholesterolemia, unspecified: Secondary | ICD-10-CM

## 2018-02-03 DIAGNOSIS — Z6834 Body mass index (BMI) 34.0-34.9, adult: Secondary | ICD-10-CM

## 2018-02-03 DIAGNOSIS — G8929 Other chronic pain: Secondary | ICD-10-CM

## 2018-02-03 DIAGNOSIS — K7581 Nonalcoholic steatohepatitis (NASH): Secondary | ICD-10-CM | POA: Diagnosis not present

## 2018-02-03 NOTE — Patient Instructions (Signed)
     IF you received an x-ray today, you will receive an invoice from [facility]. Please contact [facility] at [phone] with questions or concerns regarding your invoice.   IF you received labwork today, you will receive an invoice from [facility]. Please contact [facility] at [phone] with questions or concerns regarding your invoice.   Our billing staff will not be able to assist you with questions regarding bills from these companies.  You will be contacted with the lab results as soon as they are available. The fastest way to get your results is to activate your My Chart account. Instructions are located on the last page of this paperwork. If you have not heard from us regarding the results in 2 weeks, please contact this office.     

## 2018-02-08 ENCOUNTER — Encounter: Payer: Self-pay | Admitting: Family Medicine

## 2018-02-08 MED ORDER — ATORVASTATIN CALCIUM 10 MG PO TABS: 10.0000 mg | ORAL_TABLET | Freq: Every day | ORAL | 1 refills | Status: DC

## 2018-02-08 MED ORDER — CYCLOBENZAPRINE HCL 5 MG PO TABS: ORAL_TABLET | ORAL | 1 refills | Status: DC

## 2018-02-08 MED ORDER — TRAMADOL HCL 50 MG PO TABS: 50.0000 mg | ORAL_TABLET | Freq: Two times a day (BID) | ORAL | 3 refills | Status: DC | PRN

## 2018-02-08 MED ORDER — MELOXICAM 15 MG PO TABS: 15.0000 mg | ORAL_TABLET | Freq: Every day | ORAL | 1 refills | Status: DC | PRN

## 2018-03-14 ENCOUNTER — Telehealth: Payer: Self-pay | Admitting: Family Medicine

## 2018-03-14 NOTE — Telephone Encounter (Signed)
Called pt to let them know we would need to reschedule their appt 04/25/18. Left VM

## 2018-04-24 ENCOUNTER — Ambulatory Visit: Payer: BC Managed Care – PPO | Admitting: Urgent Care

## 2018-04-25 ENCOUNTER — Encounter: Payer: BC Managed Care – PPO | Admitting: Urgent Care

## 2018-04-29 ENCOUNTER — Ambulatory Visit (INDEPENDENT_AMBULATORY_CARE_PROVIDER_SITE_OTHER): Payer: BC Managed Care – PPO | Admitting: Urgent Care

## 2018-04-29 ENCOUNTER — Other Ambulatory Visit: Payer: Self-pay

## 2018-04-29 ENCOUNTER — Encounter: Payer: Self-pay | Admitting: Urgent Care

## 2018-04-29 VITALS — BP 124/83 | HR 71 | Temp 98.5°F | Resp 18 | Ht 71.65 in | Wt 283.6 lb

## 2018-04-29 DIAGNOSIS — Z23 Encounter for immunization: Secondary | ICD-10-CM | POA: Diagnosis not present

## 2018-04-29 DIAGNOSIS — Z0000 Encounter for general adult medical examination without abnormal findings: Secondary | ICD-10-CM

## 2018-04-29 DIAGNOSIS — J4520 Mild intermittent asthma, uncomplicated: Secondary | ICD-10-CM

## 2018-04-29 DIAGNOSIS — G8929 Other chronic pain: Secondary | ICD-10-CM

## 2018-04-29 DIAGNOSIS — M545 Low back pain, unspecified: Secondary | ICD-10-CM

## 2018-04-29 DIAGNOSIS — Z9889 Other specified postprocedural states: Secondary | ICD-10-CM

## 2018-04-29 DIAGNOSIS — M5136 Other intervertebral disc degeneration, lumbar region: Secondary | ICD-10-CM

## 2018-04-29 DIAGNOSIS — K760 Fatty (change of) liver, not elsewhere classified: Secondary | ICD-10-CM

## 2018-04-29 MED ORDER — CYCLOBENZAPRINE HCL 5 MG PO TABS: ORAL_TABLET | ORAL | 1 refills | Status: DC

## 2018-04-29 MED ORDER — TRAMADOL HCL 50 MG PO TABS: 50.0000 mg | ORAL_TABLET | Freq: Two times a day (BID) | ORAL | 0 refills | Status: DC | PRN

## 2018-04-29 MED ORDER — MELOXICAM 15 MG PO TABS: 15.0000 mg | ORAL_TABLET | Freq: Every day | ORAL | 1 refills | Status: DC | PRN

## 2018-04-29 MED ORDER — ALBUTEROL SULFATE HFA 108 (90 BASE) MCG/ACT IN AERS: 2.0000 | INHALATION_SPRAY | Freq: Four times a day (QID) | RESPIRATORY_TRACT | 1 refills | Status: DC | PRN

## 2018-04-29 NOTE — Patient Instructions (Addendum)
Dr. Mitchel Honour and Dr. Carlota Raspberry are excellent options for you.     If you have lab work done today you will be contacted with your lab results within the next 2 weeks.  If you have not heard from [place] then please contact us. The fastest way to get your results is to register for My Chart.     Health Maintenance, Male A healthy lifestyle and preventive care is important for your health and wellness. Ask your health care provider about what schedule of regular examinations is right for you. What should I know about weight and diet? Eat a Healthy Diet  Eat plenty of vegetables, fruits, whole grains, low-fat dairy products, and lean protein.  Do not eat a lot of foods high in solid fats, added sugars, or salt.  Maintain a Healthy Weight Regular exercise can help you achieve or maintain a healthy weight. You should:  Do at least 150 minutes of exercise each week. The exercise should increase your heart rate and make you sweat (moderate-intensity exercise).  Do strength-training exercises at least twice a week.  Watch Your Levels of Cholesterol and Blood Lipids  Have your blood tested for lipids and cholesterol every 5 years starting at 46 years of age. If you are at high risk for heart disease, you should start having your blood tested when you are 46 years old. You may need to have your cholesterol levels checked more often if: ? Your lipid or cholesterol levels are high. ? You are older than 46 years of age. ? You are at high risk for heart disease.  What should I know about cancer screening? Many types of cancers can be detected early and may often be prevented. Lung Cancer  You should be screened every year for lung cancer if: ? You are a current smoker who has smoked for at least 30 years. ? You are a former smoker who has quit within the past 15 years.  Talk to your health care provider about your screening options, when you should start screening, and how often you should be  screened.  Colorectal Cancer  Routine colorectal cancer screening usually begins at 46 years of age and should be repeated every 5-10 years until you are 46 years old. You may need to be screened more often if early forms of precancerous polyps or small growths are found. Your health care provider may recommend screening at an earlier age if you have risk factors for colon cancer.  Your health care provider may recommend using home test kits to check for hidden blood in the stool.  A small camera at the end of a tube can be used to examine your colon (sigmoidoscopy or colonoscopy). This checks for the earliest forms of colorectal cancer.  Prostate and Testicular Cancer  Depending on your age and overall health, your health care provider may do certain tests to screen for prostate and testicular cancer.  Talk to your health care provider about any symptoms or concerns you have about testicular or prostate cancer.  Skin Cancer  Check your skin from head to toe regularly.  Tell your health care provider about any new moles or changes in moles, especially if: ? There is a change in a mole's size, shape, or color. ? You have a mole that is larger than a pencil eraser.  Always use sunscreen. Apply sunscreen liberally and repeat throughout the day.  Protect yourself by wearing long sleeves, pants, a wide-brimmed hat, and sunglasses when outside.  What should I know about heart disease, diabetes, and high blood pressure?  If you are 13-27 years of age, have your blood pressure checked every 3-5 years. If you are 29 years of age or older, have your blood pressure checked every year. You should have your blood pressure measured twice-once when you are at a hospital or clinic, and once when you are not at a hospital or clinic. Record the average of the two measurements. To check your blood pressure when you are not at a hospital or clinic, you can use: ? An automated blood pressure machine at a  pharmacy. ? A home blood pressure monitor.  Talk to your health care provider about your target blood pressure.  If you are between 39-59 years old, ask your health care provider if you should take aspirin to prevent heart disease.  Have regular diabetes screenings by checking your fasting blood sugar level. ? If you are at a normal weight and have a low risk for diabetes, have this test once every three years after the age of 22. ? If you are overweight and have a high risk for diabetes, consider being tested at a younger age or more often.  A one-time screening for abdominal aortic aneurysm (AAA) by ultrasound is recommended for men aged 49-75 years who are current or former smokers. What should I know about preventing infection? Hepatitis B If you have a higher risk for hepatitis B, you should be screened for this virus. Talk with your health care provider to find out if you are at risk for hepatitis B infection. Hepatitis C Blood testing is recommended for:  Everyone born from [birth year] through 1964.  Anyone with known risk factors for hepatitis C.  Sexually Transmitted Diseases (STDs)  You should be screened each year for STDs including gonorrhea and chlamydia if: ? You are sexually active and are younger than 46 years of age. ? You are older than 46 years of age and your health care provider tells you that you are at risk for this type of infection. ? Your sexual activity has changed since you were last screened and you are at an increased risk for chlamydia or gonorrhea. Ask your health care provider if you are at risk.  Talk with your health care provider about whether you are at high risk of being infected with HIV. Your health care provider may recommend a prescription medicine to help prevent HIV infection.  What else can I do?  Schedule regular health, dental, and eye exams.  Stay current with your vaccines (immunizations).  Do not use any tobacco products, such as  cigarettes, chewing tobacco, and e-cigarettes. If you need help quitting, ask your health care provider.  Limit alcohol intake to no more than 2 drinks per day. One drink equals 12 ounces of beer, 5 ounces of wine, or 1 ounces of hard liquor.  Do not use street drugs.  Do not share needles.  Ask your health care provider for help if you need support or information about quitting drugs.  Tell your health care provider if you often feel depressed.  Tell your health care provider if you have ever been abused or do not feel safe at home. This information is not intended to replace advice given to you by your health care provider. Make sure you discuss any questions you have with your health care provider. Document Released: 01/19/2008 Document Revised: 03/21/2016 Document Reviewed: 04/26/2015 Elsevier Interactive Patient Education  [employer].  Fatty Liver Fatty liver, also called hepatic steatosis or steatohepatitis, is a condition in which too much fat has built up in your liver cells. The liver removes harmful substances from your bloodstream. It produces fluids your body needs. It also helps your body use and store energy from the food you eat. In many cases, fatty liver does not cause symptoms or problems. It is often diagnosed when tests are being done for other reasons. However, over time, fatty liver can cause inflammation that may lead to more serious liver problems, such as scarring of the liver (cirrhosis). What are the causes? Causes of fatty liver may include:  Drinking too much alcohol.  Poor nutrition.  Obesity.  Cushing syndrome.  Diabetes.  Hyperlipidemia.  Pregnancy.  Certain drugs.  Poisons.  Some viral infections.  What increases the risk? You may be more likely to develop fatty liver if you:  Abuse alcohol.  Are pregnant.  Are overweight.  Have diabetes.  Have hepatitis.  Have a high triglyceride level.  What are the signs or  symptoms? Fatty liver often does not cause any symptoms. In cases where symptoms develop, they can include:  Fatigue.  Weakness.  Weight loss.  Confusion.  Abdominal pain.  Yellowing of your skin and the white parts of your eyes (jaundice).  Nausea and vomiting.  How is this diagnosed? Fatty liver may be diagnosed by:  Physical exam and medical history.  Blood tests.  Imaging tests, such as an ultrasound, CT scan, or MRI.  Liver biopsy. A small sample of liver tissue is removed using a needle. The sample is then looked at under a microscope.  How is this treated? Fatty liver is often caused by other health conditions. Treatment for fatty liver may involve medicines and lifestyle changes to manage conditions such as:  Alcoholism.  High cholesterol.  Diabetes.  Being overweight or obese.  Follow these instructions at home:  Eat a healthy diet as directed by your health care provider.  Exercise regularly. This can help you lose weight and control your cholesterol and diabetes. Talk to your health care provider about an exercise plan and which activities are best for you.  Do not drink alcohol.  Take medicines only as directed by your health care provider. Contact a health care provider if: You have difficulty controlling your:  Blood sugar.  Cholesterol.  Alcohol consumption.  Get help right away if:  You have abdominal pain.  You have jaundice.  You have nausea and vomiting. This information is not intended to replace advice given to you by your health care provider. Make sure you discuss any questions you have with your health care provider. Document Released: 09/07/2005 Document Revised: 12/29/2015 Document Reviewed: 12/02/2013 Elsevier Interactive Patient Education  2018 [employer].     IF you received an x-ray today, you will receive an invoice from [facility]. Please contact [facility] at [phone] with questions or  concerns regarding your invoice.   IF you received labwork today, you will receive an invoice from [place]. Please contact [facility] at [phone] with questions or concerns regarding your invoice.   Our billing staff will not be able to assist you with questions regarding bills from these companies.  You will be contacted with the lab results as soon as they are available. The fastest way to get your results is to activate your My Chart account. Instructions are located on the last page of this paperwork. If you have not heard from [place] regarding the results  in 2 weeks, please contact this office.

## 2018-04-29 NOTE — Progress Notes (Signed)
    MRN: [id number]  Subjective:   [name] is a 48 y.o. male presenting for annual physical exam.  reports that he has quit smoking. He has never used smokeless tobacco. He reports that he drinks about 10.0 standard drinks of alcohol per week. He reports that he does not use drugs.   Medical care team includes: PCP: Patient, No Pcp Per Health Maintenance: Age appropriate screening completed.  Nathan Holder has a current medication list which includes the following prescription(s): albuterol, atorvastatin, cyclobenzaprine, meloxicam, and tramadol. He has No Known Allergies.    Nathan Holder  has a past medical history of Allergy, Asthma, Back pain, Hyperlipidemia, and NASH (nonalcoholic steatohepatitis). Also  has a past surgical history that includes Appendectomy; Back surgery; and Spine surgery (08/06/2008).  family history is not on file. He was adopted.  ROS  Objective:   Vitals: BP 124/83   Pulse 71   Temp 98.5 F (36.9 C) (Oral)   Resp 18   Ht 5' 11.65" (1.82 m)   Wt 283 lb 9.6 oz (128.6 kg)   SpO2 97%   BMI 38.84 kg/m   Physical Exam  Constitutional: He is oriented to person, place, and time. He appears well-developed and well-nourished.  HENT:  TM's intact bilaterally, no effusions or erythema. Nasal turbinates pink and moist, nasal passages patent. No sinus tenderness. Oropharynx clear, mucous membranes moist, dentition in good repair.  Eyes: Pupils are equal, round, and reactive to light. Conjunctivae and EOM are normal. Right eye exhibits no discharge. Left eye exhibits no discharge. No scleral icterus.  Neck: Normal range of motion. Neck supple. No thyromegaly present.  Cardiovascular: Normal rate, regular rhythm and intact distal pulses. Exam reveals no gallop and no friction rub.  No murmur heard. Pulmonary/Chest: No stridor. No respiratory distress. He has no wheezes. He has no rales.  Abdominal: Soft. Bowel sounds are normal. He exhibits no distension and no  mass. There is no tenderness. There is no rebound and no guarding.  Musculoskeletal: Normal range of motion. He exhibits no edema or tenderness.  Lymphadenopathy:    He has no cervical adenopathy.  Neurological: He is alert and oriented to person, place, and time. He has normal reflexes. He displays normal reflexes. Coordination normal.  Skin: Skin is warm and dry. No rash noted. No erythema. No pallor.  Psychiatric: He has a normal mood and affect.   Assessment and Plan :   Annual physical exam  Fatty infiltration of liver - Plan: Hepatic Function Panel  Flu vaccine need - Plan: Flu Vaccine QUAD 36+ mos IM  Asthma, chronic, mild intermittent, uncomplicated - Plan: albuterol (VENTOLIN HFA) 108 (90 Base) MCG/ACT inhaler  Degenerative disc disease, lumbar - Plan: traMADol (ULTRAM) 50 MG tablet  Chronic midline low back pain without sciatica - Plan: traMADol (ULTRAM) 50 MG tablet  History of back surgery  Refills provided. Discussed healthy lifestyle, diet, exercise, preventative care, vaccinations, and addressed patient's concerns.   Nathan Eagles, PA-C [facility] [phone] 04/29/2018  4:23 PM

## 2018-04-30 LAB — HEPATIC FUNCTION PANEL
ALT: 43 IU/L (ref 0–44)
AST: 27 IU/L (ref 0–40)
Albumin: 4.7 g/dL (ref 3.5–5.5)
Alkaline Phosphatase: 75 IU/L (ref 39–117)
Bilirubin Total: 0.4 mg/dL (ref 0.0–1.2)
Bilirubin, Direct: 0.15 mg/dL (ref 0.00–0.40)
Total Protein: 7.5 g/dL (ref 6.0–8.5)

## 2018-06-24 ENCOUNTER — Other Ambulatory Visit: Payer: Self-pay | Admitting: Urgent Care

## 2018-06-24 DIAGNOSIS — J4520 Mild intermittent asthma, uncomplicated: Secondary | ICD-10-CM

## 2018-07-03 ENCOUNTER — Other Ambulatory Visit: Payer: Self-pay | Admitting: Urgent Care

## 2018-07-04 NOTE — Telephone Encounter (Signed)
Requested medication (s) are due for refill today: Yes  Requested medication (s) are on the active medication list: Yes  Last refill:  04/29/18  Future visit scheduled: Yes  Notes to clinic: See request    Requested Prescriptions  Pending Prescriptions Disp Refills   cyclobenzaprine (FLEXERIL) 5 MG tablet [Pharmacy Med Name: CYCLOBENZAPRINE 5MG TABLETS] 90 tablet 0    Sig: TAKE 1 TABLET BY MOUTH THREE TIMES DAILY AS NEEDED FOR MUSCLE SPASMS     Not Delegated - Analgesics:  Muscle Relaxants Failed - 07/03/2018  3:45 AM      Failed - This refill cannot be delegated      Passed - Valid encounter within last 6 months    Recent Outpatient Visits          2 months ago Annual physical exam   [facility] [place], PA-C   5 months ago Pure hypercholesterolemia   [facility] at [facility], Renette Butters, MD   8 months ago Pure hypercholesterolemia   [facility] at [facility], Renette Butters, MD   11 months ago Sore throat   [facility] at [facility], [place], [place]   12 months ago Sore throat   [facility] at [facility], [place], [place]      Future Appointments            In 2 months Sagardia, Ines Bloomer, MD [facility] [place], [facility]

## 2018-09-12 ENCOUNTER — Encounter: Payer: Self-pay | Admitting: Emergency Medicine

## 2018-09-12 ENCOUNTER — Other Ambulatory Visit: Payer: Self-pay

## 2018-09-12 ENCOUNTER — Ambulatory Visit: Payer: BC Managed Care – PPO | Admitting: Emergency Medicine

## 2018-09-12 VITALS — BP 116/81 | HR 76 | Temp 98.6°F | Resp 16 | Ht 71.0 in | Wt 269.8 lb

## 2018-09-12 DIAGNOSIS — E7800 Pure hypercholesterolemia, unspecified: Secondary | ICD-10-CM | POA: Diagnosis not present

## 2018-09-12 DIAGNOSIS — K760 Fatty (change of) liver, not elsewhere classified: Secondary | ICD-10-CM

## 2018-09-12 DIAGNOSIS — R21 Rash and other nonspecific skin eruption: Secondary | ICD-10-CM

## 2018-09-12 DIAGNOSIS — Z9889 Other specified postprocedural states: Secondary | ICD-10-CM | POA: Diagnosis not present

## 2018-09-12 DIAGNOSIS — M5136 Other intervertebral disc degeneration, lumbar region: Secondary | ICD-10-CM

## 2018-09-12 DIAGNOSIS — M545 Low back pain: Secondary | ICD-10-CM | POA: Diagnosis not present

## 2018-09-12 DIAGNOSIS — G8929 Other chronic pain: Secondary | ICD-10-CM

## 2018-09-12 MED ORDER — MELOXICAM 15 MG PO TABS: 15.0000 mg | ORAL_TABLET | Freq: Every day | ORAL | 1 refills | Status: DC | PRN

## 2018-09-12 MED ORDER — CYCLOBENZAPRINE HCL 5 MG PO TABS: ORAL_TABLET | ORAL | 0 refills | Status: DC

## 2018-09-12 MED ORDER — ATORVASTATIN CALCIUM 10 MG PO TABS: 10.0000 mg | ORAL_TABLET | Freq: Every day | ORAL | 1 refills | Status: DC

## 2018-09-12 MED ORDER — CLOTRIMAZOLE-BETAMETHASONE 1-0.05 % EX CREA: 1.0000 "application " | TOPICAL_CREAM | Freq: Two times a day (BID) | CUTANEOUS | 0 refills | Status: AC

## 2018-09-12 MED ORDER — TRAMADOL HCL 50 MG PO TABS: 50.0000 mg | ORAL_TABLET | Freq: Two times a day (BID) | ORAL | 0 refills | Status: DC | PRN

## 2018-09-12 NOTE — Progress Notes (Signed)
Nathan Holder 48 y.o.   Chief Complaint  Patient presents with  . Medication Refill    ALL MEDICATIONS  . Rash    scabes - head and left knee area over a year    HISTORY OF PRESENT ILLNESS: This is a 48 y.o. male with history of chronic back pain status post surgery x2 in the past.  Here today for follow-up and medication refill. Also complaining of skin lesions to back of the head and left knee area for 1 year. No other complaints or medical concerns today.  HPI   Prior to Admission medications   Medication Sig Start Date End Date Taking? Authorizing Provider  atorvastatin (LIPITOR) 10 MG tablet Take 1 tablet (10 mg total) by mouth daily. 02/08/18  Yes [name], MD  cyclobenzaprine (FLEXERIL) 5 MG tablet TAKE 1 TABLET BY MOUTH THREE TIMES DAILY AS NEEDED FOR MUSCLE SPASMS. Needs new doctor for refills. 07/06/18  Yes Forrest Moron, MD  meloxicam (MOBIC) 15 MG tablet Take 1 tablet (15 mg total) by mouth daily as needed for pain. 04/29/18  Yes Jaynee Eagles, PA-C  traMADol (ULTRAM) 50 MG tablet Take 1 tablet (50 mg total) by mouth every 12 (twelve) hours as needed. 04/29/18  Yes Jaynee Eagles, PA-C  albuterol (PROVENTIL HFA;VENTOLIN HFA) 108 (90 Base) MCG/ACT inhaler INHALE 2 PUFFS INTO THE LUNGS EVERY 6 HOURS AS NEEDED FOR WHEEZING Patient not taking: Reported on 09/12/2018 06/24/18   Forrest Moron, MD    No Known Allergies  Patient Active Problem List   Diagnosis Date Noted  . Fatty infiltration of liver 04/29/2018  . Degenerative disc disease, lumbar 05/30/2015  . Obesity 04/05/2014  . Pure hypercholesterolemia 04/05/2014  . Chronic lower back pain 04/05/2014    Past Medical History:  Diagnosis Date  . Allergy    mold pollen  . Asthma    very mild  . Back pain   . Hyperlipidemia   . NASH (nonalcoholic steatohepatitis)    abdominal US revealed    Past Surgical History:  Procedure Laterality Date  . APPENDECTOMY    . BACK SURGERY    . SPINE SURGERY   08/06/2008   diskectomy L4-5 2010, 2004.    Social History   Socioeconomic History  . Marital status: Legally Separated    Spouse name: Not on file  . Number of children: 2  . Years of education: Not on file  . Highest education level: Not on file  Occupational History  . Occupation: Pharmacist, hospital    Comment: Pre-K teacher  Social Needs  . Financial resource strain: Not on file  . Food insecurity:    Worry: Not on file    Inability: Not on file  . Transportation needs:    Medical: Not on file    Non-medical: Not on file  Tobacco Use  . Smoking status: Former Research scientist (life sciences)  . Smokeless tobacco: Never Used  Substance and Sexual Activity  . Alcohol use: Yes    Alcohol/week: 10.0 standard drinks    Types: 10 Standard drinks or equivalent per week    Comment: drink daily-2 drinks, per Health Survey (05/03/14)  10-16 drinks  . Drug use: No  . Sexual activity: Yes    Partners: Female    Birth control/protection: None    Comment: number of sex partners in the last 12 months 1  Lifestyle  . Physical activity:    Days per week: Not on file    Minutes per session: Not on  file  . Stress: Not on file  Relationships  . Social connections:    Talks on phone: Not on file    Gets together: Not on file    Attends religious service: Not on file    Active member of club or organization: Not on file    Attends meetings of clubs or organizations: Not on file    Relationship status: Not on file  . Intimate partner violence:    Fear of current or ex partner: Not on file    Emotionally abused: Not on file    Physically abused: Not on file    Forced sexual activity: Not on file  Other Topics Concern  . Not on file  Social History Narrative   Marital status: married x 20 years.      Children: 2 children (81 yo daughter, 19 yo son)      Lives: with wife, 2 children, 2 cats.      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      Tobacco:  None       Alcohol:  1-2  drinks per day; was drinking 20 drinks per week in 2016.      Exercise: none in 2018      Seatbelt: 100%; no texting.      Guns:  Guns lots of them; concealed carrier permit; loaded but secured.      Family History  Adopted: Yes     Review of Systems  Constitutional: Negative.  Negative for chills, fever and weight loss.  HENT: Negative.   Eyes: Negative.   Respiratory: Negative for cough and shortness of breath.   Cardiovascular: Negative for chest pain and palpitations.  Gastrointestinal: Negative for abdominal pain, blood in stool, melena, nausea and vomiting.  Genitourinary: Negative.   Musculoskeletal: Positive for back pain (Chronic). Negative for myalgias and neck pain.  Skin: Positive for rash.  Neurological: Negative for dizziness, speech change, focal weakness and headaches.  Endo/Heme/Allergies: Negative.   All other systems reviewed and are negative.   Vitals:   09/12/18 1555  BP: 116/81  Pulse: 76  Resp: 16  Temp: 98.6 F (37 C)  SpO2: 97%    Physical Exam Vitals signs reviewed.  Constitutional:      Appearance: Normal appearance.  HENT:     Head: Normocephalic and atraumatic.     Mouth/Throat:     Mouth: Mucous membranes are moist.     Pharynx: Oropharynx is clear.  Eyes:     Extraocular Movements: Extraocular movements intact.     Conjunctiva/sclera: Conjunctivae normal.     Pupils: Pupils are equal, round, and reactive to light.  Neck:     Musculoskeletal: Normal range of motion and neck supple.  Cardiovascular:     Rate and Rhythm: Normal rate and regular rhythm.     Pulses: Normal pulses.     Heart sounds: Normal heart sounds.  Pulmonary:     Effort: Pulmonary effort is normal.     Breath sounds: Normal breath sounds.  Abdominal:     Palpations: Abdomen is soft.     Tenderness: There is no abdominal tenderness.  Musculoskeletal: Normal range of motion.        General: No swelling or tenderness.     Right lower leg: No edema.     Left  lower leg: No edema.  Skin:    General: Skin is warm and dry.     Comments: Ringworm-like lesion to  back of the head and left knee area  Neurological:     Mental Status: He is alert.     A total of 25 minutes was spent in the room with the patient, greater than 50% of which was in counseling/coordination of care regarding chronic medical problems, treatment, medications, and need for follow-up.   ASSESSMENT & PLAN: Nathan Holder was seen today for medication refill and rash.  Diagnoses and all orders for this visit:  Chronic midline low back pain without sciatica -     traMADol (ULTRAM) 50 MG tablet; Take 1 tablet (50 mg total) by mouth every 12 (twelve) hours as needed.  Fatty infiltration of liver -     Lipid panel -     CMP14+EGFR  History of back surgery  Pure hypercholesterolemia -     Lipid panel -     CMP14+EGFR -     atorvastatin (LIPITOR) 10 MG tablet; Take 1 tablet (10 mg total) by mouth daily.  Degenerative disc disease, lumbar -     traMADol (ULTRAM) 50 MG tablet; Take 1 tablet (50 mg total) by mouth every 12 (twelve) hours as needed.  Rash and nonspecific skin eruption Comments: Suspected ringworm Orders: -     clotrimazole-betamethasone (LOTRISONE) cream; Apply 1 application topically 2 (two) times daily.  Other orders -     cyclobenzaprine (FLEXERIL) 5 MG tablet; TAKE 1 TABLET BY MOUTH THREE TIMES DAILY AS NEEDED FOR MUSCLE SPASMS. Needs new doctor for refills. -     meloxicam (MOBIC) 15 MG tablet; Take 1 tablet (15 mg total) by mouth daily as needed for pain.    Patient Instructions       If you have lab work done today you will be contacted with your lab results within the next 2 weeks.  If you have not heard from [place] then please contact us. The fastest way to get your results is to register for My Chart.   IF you received an x-ray today, you will receive an invoice from [facility]. Please contact [facility] at [phone] with  questions or concerns regarding your invoice.   IF you received labwork today, you will receive an invoice from [facility]. Please contact [facility] at [phone] with questions or concerns regarding your invoice.   Our billing staff will not be able to assist you with questions regarding bills from these companies.  You will be contacted with the lab results as soon as they are available. The fastest way to get your results is to activate your My Chart account. Instructions are located on the last page of this paperwork. If you have not heard from [place] regarding the results in 2 weeks, please contact this office.     Chronic Back Pain When back pain lasts longer than 3 months, it is called chronic back pain. Pain may get worse at certain times (flare-ups). There are things you can do at home to manage your pain. Follow these instructions at home: Activity      Avoid bending and other activities that make pain worse.  When standing: ? Keep your upper back and neck straight. ? Keep your shoulders pulled back. ? Avoid slouching.  When sitting: ? Keep your back straight. ? Relax your shoulders. Do not round your shoulders or pull them backward.  Do not sit or stand in one place for long periods of time.  Take short rest breaks during the day. Lying down or standing is usually better than sitting. Resting can help  relieve pain.  When sitting or lying down for a long time, do some mild activity or stretching. This will help to prevent stiffness and pain.  Get regular exercise. Ask your doctor what activities are safe for you.  Do not lift anything that is heavier than 10 lb (4.5 kg). To prevent injury when you lift things: ? Bend your knees. ? Keep the weight close to your body. ? Avoid twisting. Managing pain  If told, put ice on the painful area. Your doctor may tell you to use ice for 24-48 hours after a flare-up starts. ? Put ice in a plastic bag. ? Place a towel between your  skin and the bag. ? Leave the ice on for 20 minutes, 2-3 times a day.  If told, put heat on the painful area as often as told by your doctor. Use the heat source that your doctor recommends, such as a moist heat pack or a heating pad. ? Place a towel between your skin and the heat source. ? Leave the heat on for 20-30 minutes. ? Remove the heat if your skin turns bright red. This is especially important if you are unable to feel pain, heat, or cold. You may have a greater risk of getting burned.  Soak in a warm bath. This can help relieve pain.  Take over-the-counter and prescription medicines only as told by your doctor. General instructions  Sleep on a firm mattress. Try lying on your side with your knees slightly bent. If you lie on your back, put a pillow under your knees.  Keep all follow-up visits as told by your doctor. This is important. Contact a doctor if:  You have pain that does not get better with rest or medicine. Get help right away if:  One or both of your arms or legs feel weak.  One or both of your arms or legs lose feeling (numbness).  You have trouble controlling when you poop (bowel movement) or pee (urinate).  You feel sick to your stomach (nauseous).  You throw up (vomit).  You have belly (abdominal) pain.  You have shortness of breath.  You pass out (faint). Summary  When back pain lasts longer than 3 months, it is called chronic back pain.  Pain may get worse at certain times (flare-ups).  Use ice and heat as told by your doctor. Your doctor may tell you to use ice after flare-ups. This information is not intended to replace advice given to you by your health care provider. Make sure you discuss any questions you have with your health care provider. Document Released: 01/09/2008 Document Revised: 03/07/2017 Document Reviewed: 03/07/2017 Elsevier Interactive Patient Education  2019 Elsevier Inc.      Agustina Caroli, MD [facility]

## 2018-09-12 NOTE — Patient Instructions (Addendum)
If you have lab work done today you will be contacted with your lab results within the next 2 weeks.  If you have not heard from [place] then please contact us. The fastest way to get your results is to register for My Chart.   IF you received an x-ray today, you will receive an invoice from [facility]. Please contact [facility] at [phone] with questions or concerns regarding your invoice.   IF you received labwork today, you will receive an invoice from [facility]. Please contact [facility] at [phone] with questions or concerns regarding your invoice.   Our billing staff will not be able to assist you with questions regarding bills from these companies.  You will be contacted with the lab results as soon as they are available. The fastest way to get your results is to activate your My Chart account. Instructions are located on the last page of this paperwork. If you have not heard from [place] regarding the results in 2 weeks, please contact this office.     Chronic Back Pain When back pain lasts longer than 3 months, it is called chronic back pain. Pain may get worse at certain times (flare-ups). There are things you can do at home to manage your pain. Follow these instructions at home: Activity      Avoid bending and other activities that make pain worse.  When standing: ? Keep your upper back and neck straight. ? Keep your shoulders pulled back. ? Avoid slouching.  When sitting: ? Keep your back straight. ? Relax your shoulders. Do not round your shoulders or pull them backward.  Do not sit or stand in one place for long periods of time.  Take short rest breaks during the day. Lying down or standing is usually better than sitting. Resting can help relieve pain.  When sitting or lying down for a long time, do some mild activity or stretching. This will help to prevent stiffness and pain.  Get regular exercise. Ask your doctor what activities are safe  for you.  Do not lift anything that is heavier than 10 lb (4.5 kg). To prevent injury when you lift things: ? Bend your knees. ? Keep the weight close to your body. ? Avoid twisting. Managing pain  If told, put ice on the painful area. Your doctor may tell you to use ice for 24-48 hours after a flare-up starts. ? Put ice in a plastic bag. ? Place a towel between your skin and the bag. ? Leave the ice on for 20 minutes, 2-3 times a day.  If told, put heat on the painful area as often as told by your doctor. Use the heat source that your doctor recommends, such as a moist heat pack or a heating pad. ? Place a towel between your skin and the heat source. ? Leave the heat on for 20-30 minutes. ? Remove the heat if your skin turns bright red. This is especially important if you are unable to feel pain, heat, or cold. You may have a greater risk of getting burned.  Soak in a warm bath. This can help relieve pain.  Take over-the-counter and prescription medicines only as told by your doctor. General instructions  Sleep on a firm mattress. Try lying on your side with your knees slightly bent. If you lie on your back, put a pillow under your knees.  Keep all follow-up visits as told by your doctor. This is important. Contact a doctor if:  You have pain that does not get better with rest or medicine. Get help right away if:  One or both of your arms or legs feel weak.  One or both of your arms or legs lose feeling (numbness).  You have trouble controlling when you poop (bowel movement) or pee (urinate).  You feel sick to your stomach (nauseous).  You throw up (vomit).  You have belly (abdominal) pain.  You have shortness of breath.  You pass out (faint). Summary  When back pain lasts longer than 3 months, it is called chronic back pain.  Pain may get worse at certain times (flare-ups).  Use ice and heat as told by your doctor. Your doctor may tell you to use ice after  flare-ups. This information is not intended to replace advice given to you by your health care provider. Make sure you discuss any questions you have with your health care provider. Document Released: 01/09/2008 Document Revised: 03/07/2017 Document Reviewed: 03/07/2017 Elsevier Interactive Patient Education  2019 [employer].

## 2018-09-13 LAB — CMP14+EGFR
ALBUMIN: 4.6 g/dL (ref 4.0–5.0)
ALT: 32 IU/L (ref 0–44)
AST: 24 IU/L (ref 0–40)
Albumin/Globulin Ratio: 1.6 (ref 1.2–2.2)
Alkaline Phosphatase: 66 IU/L (ref 39–117)
BILIRUBIN TOTAL: 0.7 mg/dL (ref 0.0–1.2)
BUN / CREAT RATIO: 15 (ref 9–20)
BUN: 16 mg/dL (ref 6–24)
CALCIUM: 9.8 mg/dL (ref 8.7–10.2)
CO2: 22 mmol/L (ref 20–29)
Chloride: 101 mmol/L (ref 96–106)
Creatinine, Ser: 1.08 mg/dL (ref 0.76–1.27)
GFR calc Af Amer: 95 mL/min/{1.73_m2} (ref >59)
GFR, EST NON AFRICAN AMERICAN: 82 mL/min/{1.73_m2} (ref >59)
Globulin, Total: 2.8 g/dL (ref 1.5–4.5)
Glucose: 67 mg/dL (ref 65–99)
Potassium: 4.7 mmol/L (ref 3.5–5.2)
SODIUM: 140 mmol/L (ref 134–144)
TOTAL PROTEIN: 7.4 g/dL (ref 6.0–8.5)

## 2018-09-13 LAB — LIPID PANEL
CHOLESTEROL TOTAL: 178 mg/dL (ref 100–199)
Chol/HDL Ratio: 3.4 ratio (ref 0.0–5.0)
HDL: 52 mg/dL (ref >39)
LDL Calculated: 107 mg/dL — ABNORMAL HIGH (ref 0–99)
TRIGLYCERIDES: 93 mg/dL (ref 0–149)
VLDL Cholesterol Cal: 19 mg/dL (ref 5–40)

## 2018-09-16 NOTE — Progress Notes (Signed)
Letter mailed pt

## 2018-10-21 ENCOUNTER — Encounter: Payer: Self-pay | Admitting: Emergency Medicine

## 2018-11-06 ENCOUNTER — Other Ambulatory Visit: Payer: Self-pay | Admitting: Emergency Medicine

## 2018-11-08 MED ORDER — CYCLOBENZAPRINE HCL 5 MG PO TABS: ORAL_TABLET | ORAL | 0 refills | Status: DC

## 2018-11-11 ENCOUNTER — Other Ambulatory Visit: Payer: Self-pay | Admitting: Emergency Medicine

## 2018-11-11 DIAGNOSIS — M545 Low back pain, unspecified: Secondary | ICD-10-CM

## 2018-11-11 DIAGNOSIS — M51369 Other intervertebral disc degeneration, lumbar region without mention of lumbar back pain or lower extremity pain: Secondary | ICD-10-CM

## 2018-11-11 DIAGNOSIS — G8929 Other chronic pain: Secondary | ICD-10-CM

## 2018-11-11 DIAGNOSIS — M5136 Other intervertebral disc degeneration, lumbar region: Secondary | ICD-10-CM

## 2018-11-11 NOTE — Telephone Encounter (Signed)
Requested medication (s) are due for refill today: yes  Requested medication (s) are on the active medication list: Yes  Last refill 09/12/2018  #60  0 refills  Future visit scheduled no  Notes to clinic:not delegated  Requested Prescriptions  Pending Prescriptions Disp Refills   traMADol (ULTRAM) 50 MG tablet [Pharmacy Med Name: TRAMADOL 50MG TABLETS] 60 tablet     Sig: TAKE 1 TABLET(50 MG) BY MOUTH EVERY 12 HOURS AS NEEDED     Not Delegated - Analgesics:  Opioid Agonists Failed - 11/11/2018  9:43 AM      Failed - This refill cannot be delegated      Failed - Urine Drug Screen completed in last 360 days.      Passed - Valid encounter within last 6 months    Recent Outpatient Visits          2 months ago Chronic midline low back pain without sciatica   Primary Care at [facility], Ines Bloomer, MD   6 months ago Annual physical exam   Primary Care at [place], [place]   9 months ago Pure hypercholesterolemia   Primary Care at [facility], Renette Butters, MD   1 year ago Pure hypercholesterolemia   Primary Care at [facility], Renette Butters, MD   1 year ago Sore throat   Primary Care at [facility], [place], [place]

## 2018-11-13 ENCOUNTER — Other Ambulatory Visit: Payer: Self-pay | Admitting: Emergency Medicine

## 2018-11-13 DIAGNOSIS — M5136 Other intervertebral disc degeneration, lumbar region: Secondary | ICD-10-CM

## 2018-11-13 DIAGNOSIS — M545 Low back pain: Secondary | ICD-10-CM

## 2018-11-13 DIAGNOSIS — G8929 Other chronic pain: Secondary | ICD-10-CM

## 2018-11-13 NOTE — Telephone Encounter (Signed)
Requested medication (s) are due for refill today: Yes  Requested medication (s) are on the active medication list: Yes  Last refill:  09/12/18  Future visit scheduled: No  Notes to clinic: Unable to refill, cannot delegate     Requested Prescriptions  Pending Prescriptions Disp Refills   traMADol (ULTRAM) 50 MG tablet 60 tablet 0    Sig: Take 1 tablet (50 mg total) by mouth every 12 (twelve) hours as needed.     Not Delegated - Analgesics:  Opioid Agonists Failed - 11/13/2018 11:31 AM      Failed - This refill cannot be delegated      Failed - Urine Drug Screen completed in last 360 days.      Passed - Valid encounter within last 6 months    Recent Outpatient Visits          2 months ago Chronic midline low back pain without sciatica   Primary Care at [facility], Ines Bloomer, MD   6 months ago Annual physical exam   Primary Care at [place], [place]   9 months ago Pure hypercholesterolemia   Primary Care at [facility], Renette Butters, MD   1 year ago Pure hypercholesterolemia   Primary Care at [facility], Renette Butters, MD   1 year ago Sore throat   Primary Care at [facility], [place], [place]

## 2018-12-06 ENCOUNTER — Other Ambulatory Visit: Payer: Self-pay | Admitting: Emergency Medicine

## 2018-12-30 ENCOUNTER — Other Ambulatory Visit: Payer: Self-pay | Admitting: Emergency Medicine

## 2018-12-30 DIAGNOSIS — M5136 Other intervertebral disc degeneration, lumbar region: Secondary | ICD-10-CM

## 2018-12-30 DIAGNOSIS — M545 Low back pain, unspecified: Secondary | ICD-10-CM

## 2018-12-30 DIAGNOSIS — G8929 Other chronic pain: Secondary | ICD-10-CM

## 2019-03-04 ENCOUNTER — Other Ambulatory Visit: Payer: Self-pay | Admitting: Family Medicine

## 2019-03-04 ENCOUNTER — Other Ambulatory Visit: Payer: Self-pay | Admitting: Emergency Medicine

## 2019-03-04 DIAGNOSIS — E7800 Pure hypercholesterolemia, unspecified: Secondary | ICD-10-CM

## 2019-03-04 NOTE — Telephone Encounter (Signed)
Requested Prescriptions  Pending Prescriptions Disp Refills  . meloxicam (MOBIC) 15 MG tablet [Pharmacy Med Name: MELOXICAM 15MG TABLETS] 90 tablet 0    Sig: TAKE 1 TABLET(15 MG) BY MOUTH DAILY AS NEEDED FOR PAIN     Analgesics:  COX2 Inhibitors Failed - 03/04/2019  3:48 PM      Failed - HGB in normal range and within 360 days    Hemoglobin  Date Value Ref Range Status  10/23/2017 16.3 13.0 - 17.7 g/dL Final         Passed - Cr in normal range and within 360 days    Creat  Date Value Ref Range Status  04/03/2016 0.98 0.60 - 1.35 mg/dL Final   Creatinine, Ser  Date Value Ref Range Status  09/12/2018 1.08 0.76 - 1.27 mg/dL Final         Passed - Patient is not pregnant      Passed - Valid encounter within last 12 months    Recent Outpatient Visits          5 months ago Chronic midline low back pain without sciatica   Primary Care at [place], Ines Bloomer, MD   10 months ago Annual physical exam   Primary Care at [place], [place]   1 year ago Pure hypercholesterolemia   Primary Care at [facility], Renette Butters, MD   1 year ago Pure hypercholesterolemia   Primary Care at [facility], Renette Butters, MD   1 year ago Sore throat   Primary Care at [facility], [place], [place]             . atorvastatin (LIPITOR) 10 MG tablet [Pharmacy Med Name: ATORVASTATIN 10MG TABLETS] 90 tablet 0    Sig: TAKE 1 TABLET(10 MG) BY MOUTH DAILY     Cardiovascular:  Antilipid - Statins Failed - 03/04/2019  3:48 PM      Failed - LDL in normal range and within 360 days    LDL Calculated  Date Value Ref Range Status  09/12/2018 107 (H) 0 - 99 mg/dL Final         Passed - Total Cholesterol in normal range and within 360 days    Cholesterol, Total  Date Value Ref Range Status  09/12/2018 178 100 - 199 mg/dL Final         Passed - HDL in normal range and within 360 days    HDL  Date Value Ref Range Status  09/12/2018 52 >39 mg/dL Final         Passed - Triglycerides in normal  range and within 360 days    Triglycerides  Date Value Ref Range Status  09/12/2018 93 0 - 149 mg/dL Final         Passed - Patient is not pregnant      Passed - Valid encounter within last 12 months    Recent Outpatient Visits          5 months ago Chronic midline low back pain without sciatica   Primary Care at [facility], Ines Bloomer, MD   10 months ago Annual physical exam   Primary Care at [place], [place]   1 year ago Pure hypercholesterolemia   Primary Care at [facility], Renette Butters, MD   1 year ago Pure hypercholesterolemia   Primary Care at [facility], Renette Butters, MD   1 year ago Sore throat   Primary Care at [facility], [place], [place]

## 2019-03-04 NOTE — Telephone Encounter (Signed)
Requested medications are due for refill today?  Yes  Requested medications are on the active medication list?  Yes  Last refill 11/08/2018  Future visit scheduled?  No  Notes to clinic   Requested Prescriptions  Pending Prescriptions Disp Refills   cyclobenzaprine (FLEXERIL) 5 MG tablet [Pharmacy Med Name: CYCLOBENZAPRINE 5MG TABLETS] 90 tablet 0    Sig: TAKE 1 TABLET BY MOUTH THREE TIMES DAILY AS NEEDED FOR MUSCLE SPASMS     Not Delegated - Analgesics:  Muscle Relaxants Failed - 03/04/2019  3:49 PM      Failed - This refill cannot be delegated      Passed - Valid encounter within last 6 months    Recent Outpatient Visits          5 months ago Chronic midline low back pain without sciatica   Primary Care at [facility], Ines Bloomer, MD   10 months ago Annual physical exam   Primary Care at [place], [place]   1 year ago Pure hypercholesterolemia   Primary Care at [facility], Renette Butters, MD   1 year ago Pure hypercholesterolemia   Primary Care at [facility], Renette Butters, MD   1 year ago Sore throat   Primary Care at [facility], [place], [place]

## 2019-04-05 ENCOUNTER — Other Ambulatory Visit: Payer: Self-pay | Admitting: Emergency Medicine

## 2019-04-05 NOTE — Telephone Encounter (Signed)
Requested medication (s) are due for refill today: yes  Requested medication (s) are on the active medication list: yes  Last refill: 03/04/2019  Future visit scheduled: No  Notes to clinic: Med not delegated    Requested Prescriptions  Pending Prescriptions Disp Refills   cyclobenzaprine (FLEXERIL) 5 MG tablet [Pharmacy Med Name: CYCLOBENZAPRINE 5MG TABLETS] 90 tablet 0    Sig: TAKE 1 TABLET BY MOUTH THREE TIMES DAILY AS NEEDED FOR MUSCLE SPASMS     Not Delegated - Analgesics:  Muscle Relaxants Failed - 04/05/2019  6:20 AM      Failed - This refill cannot be delegated      Failed - Valid encounter within last 6 months    Recent Outpatient Visits          6 months ago Chronic midline low back pain without sciatica   Primary Care at [facility], Ines Bloomer, MD   11 months ago Annual physical exam   Primary Care at [place], [place]   1 year ago Pure hypercholesterolemia   Primary Care at [facility], Renette Butters, MD   1 year ago Pure hypercholesterolemia   Primary Care at [facility], Renette Butters, MD   1 year ago Sore throat   Primary Care at [facility], [place], [place]

## 2020-05-04 ENCOUNTER — Other Ambulatory Visit: Payer: Self-pay | Admitting: Emergency Medicine

## 2020-05-04 NOTE — Telephone Encounter (Signed)
Requested medication (s) are due for refill today: yes  Requested medication (s) are on the active medication list: yes  Last refill:  02/05/20  Future visit scheduled: no  Notes to clinic:  no valid encounter within last 12 months    Requested Prescriptions  Pending Prescriptions Disp Refills   meloxicam (MOBIC) 15 MG tablet [Pharmacy Med Name: MELOXICAM 15MG TABLETS] 90 tablet 0    Sig: TAKE 1 TABLET(15 MG) BY MOUTH DAILY AS NEEDED FOR PAIN      Analgesics:  COX2 Inhibitors Failed - 05/04/2020  7:43 AM      Failed - HGB in normal range and within 360 days    Hemoglobin  Date Value Ref Range Status  10/23/2017 16.3 13.0 - 17.7 g/dL Final          Failed - Cr in normal range and within 360 days    Creat  Date Value Ref Range Status  04/03/2016 0.98 0.60 - 1.35 mg/dL Final   Creatinine, Ser  Date Value Ref Range Status  09/12/2018 1.08 0.76 - 1.27 mg/dL Final          Failed - Valid encounter within last 12 months    Recent Outpatient Visits           1 year ago Chronic midline low back pain without sciatica   [facility], Ines Bloomer, MD   2 years ago Annual physical exam   Primary Care at [place], [place]   2 years ago Pure hypercholesterolemia   Primary Care at [place], Renette Butters, MD   2 years ago Pure hypercholesterolemia   Primary Care at [facility], Renette Butters, MD   2 years ago Sore throat   Primary Care at [place], [place], [place] - Patient is not pregnant

## 2024-05-05 ENCOUNTER — Other Ambulatory Visit: Payer: Self-pay | Admitting: Family Medicine

## 2024-05-05 DIAGNOSIS — M5412 Radiculopathy, cervical region: Secondary | ICD-10-CM

## 2024-05-12 ENCOUNTER — Encounter: Payer: Self-pay | Admitting: Family Medicine

## 2024-05-14 ENCOUNTER — Encounter (INDEPENDENT_AMBULATORY_CARE_PROVIDER_SITE_OTHER): Payer: Self-pay

## 2024-05-19 ENCOUNTER — Ambulatory Visit
Admission: RE | Admit: 2024-05-19 | Discharge: 2024-05-19 | Disposition: A | Discharge status: Home / Self Care | Source: Ambulatory Visit | Attending: Family Medicine | Admitting: Family Medicine

## 2024-05-19 DIAGNOSIS — M5412 Radiculopathy, cervical region: Secondary | ICD-10-CM

## 2024-09-01 ENCOUNTER — Institutional Professional Consult (permissible substitution) (INDEPENDENT_AMBULATORY_CARE_PROVIDER_SITE_OTHER): Admitting: Family Medicine

## 2024-09-21 ENCOUNTER — Institutional Professional Consult (permissible substitution) (INDEPENDENT_AMBULATORY_CARE_PROVIDER_SITE_OTHER): Admitting: Nurse Practitioner
